# Patient Record
Sex: Male | Born: 1955 | Race: White | Hispanic: No | Marital: Married | Smoking: Former smoker
Health system: Southern US, Community
[De-identification: ages and names within clinical notes are randomized; demographics above are authoritative.]

## PROBLEM LIST (undated history)

## (undated) DIAGNOSIS — B182 Chronic viral hepatitis C: Secondary | ICD-10-CM

## (undated) DIAGNOSIS — K219 Gastro-esophageal reflux disease without esophagitis: Secondary | ICD-10-CM

## (undated) DIAGNOSIS — C801 Malignant (primary) neoplasm, unspecified: Secondary | ICD-10-CM

## (undated) DIAGNOSIS — I10 Essential (primary) hypertension: Secondary | ICD-10-CM

## (undated) HISTORY — DX: Chronic viral hepatitis C: B18.2

## (undated) HISTORY — DX: Essential (primary) hypertension: I10

## (undated) HISTORY — PX: FACIAL RECONSTRUCTION SURGERY: SHX631

---

## 2005-03-17 ENCOUNTER — Emergency Department (HOSPITAL_COMMUNITY): Admission: EM | Admit: 2005-03-17 | Discharge: 2005-03-17 | Payer: Self-pay | Admitting: Emergency Medicine

## 2005-06-06 ENCOUNTER — Emergency Department (HOSPITAL_COMMUNITY): Admission: EM | Admit: 2005-06-06 | Discharge: 2005-06-06 | Payer: Self-pay | Admitting: Emergency Medicine

## 2007-01-25 HISTORY — PX: POLYPECTOMY: SHX149

## 2007-11-14 ENCOUNTER — Emergency Department (HOSPITAL_COMMUNITY): Admission: EM | Admit: 2007-11-14 | Discharge: 2007-11-14 | Payer: Self-pay | Admitting: Emergency Medicine

## 2008-01-09 ENCOUNTER — Ambulatory Visit: Payer: Self-pay | Admitting: Gastroenterology

## 2008-01-16 ENCOUNTER — Ambulatory Visit: Payer: Self-pay | Admitting: Gastroenterology

## 2008-01-23 ENCOUNTER — Encounter: Payer: Self-pay | Admitting: Gastroenterology

## 2009-05-26 ENCOUNTER — Telehealth: Payer: Self-pay | Admitting: Gastroenterology

## 2010-01-24 HISTORY — PX: OTHER SURGICAL HISTORY: SHX169

## 2010-02-23 NOTE — Progress Notes (Signed)
Summary: Schedule Colonoscopy  Phone Note Outgoing Call Call back at [facility] Phone [phone]   Call placed by: Harlow Mares CMA Duncan Dull),  May 26, 2009 10:03 AM Call placed to: Patient Summary of Call: pt lost health insurance and will have some soon she will have her husband call back when they get some again. they are aware he needs a colonoscopy and how important it is. Initial call taken by: Harlow Mares CMA (AAMA),  May 26, 2009 10:05 AM

## 2010-10-25 LAB — POCT CARDIAC MARKERS
CKMB, poc: 4.3
Troponin i, poc: <0.05

## 2010-10-25 LAB — POCT I-STAT, CHEM 8
BUN: 16
Calcium, Ion: 1.23
Glucose, Bld: 111 — ABNORMAL HIGH
HCT: 45
Potassium: 4.5
TCO2: 25

## 2011-07-01 ENCOUNTER — Other Ambulatory Visit: Payer: Self-pay | Admitting: Physician Assistant

## 2011-10-21 ENCOUNTER — Ambulatory Visit: Payer: Self-pay | Admitting: Family Medicine

## 2011-10-21 VITALS — BP 140/90 | HR 99 | Temp 98.7°F | Resp 18 | Ht 71.0 in | Wt 204.0 lb

## 2011-10-21 DIAGNOSIS — I10 Essential (primary) hypertension: Secondary | ICD-10-CM

## 2011-10-21 MED ORDER — LISINOPRIL-HYDROCHLOROTHIAZIDE 20-12.5 MG PO TABS: 1.0000 | ORAL_TABLET | Freq: Every day | ORAL | Status: DC

## 2011-10-21 NOTE — Progress Notes (Signed)
[facility]:  Office Visit  Chief Complaint:  Chief Complaint  Patient presents with  . Medication Refill    been out for 1 month    HPI: Joe Allen is a 56 y.o. male who complains of  HTN-been off meds for several weeks. He has no SEs, He is on lisinopril/HCTZ 20/12.5 mg.  When taking BP meds it is 120s/80s.   Past Medical History  Diagnosis Date  . Hypertension    History reviewed. No pertinent past surgical history. History   Social History  . Marital Status: Married    Spouse Name: N/A    Number of Children: N/A  . Years of Education: N/A   Social History Main Topics  . Smoking status: Never Smoker   . Smokeless tobacco: None  . Alcohol Use: Yes     occass  . Drug Use: No  . Sexually Active: None   Other Topics Concern  . None   Social History Narrative  . None   No family history on file. No Known Allergies Prior to Admission medications   Medication Sig Start Date End Date Taking? Authorizing Provider  lisinopril-hydrochlorothiazide (PRINZIDE,ZESTORETIC) 20-12.5 MG per tablet Take 2 tablets by mouth daily. NEEDS OFFICE VISIT/LABS FOR MORE 07/01/11  Yes Joe Simmonds, PA-C     ROS: The patient denies fevers, chills, night sweats, unintentional weight loss, chest pain, palpitations, wheezing, dyspnea on exertion, nausea, vomiting, abdominal pain, dysuria, hematuria, melena, numbness, weakness, or tingling.   All other systems have been reviewed and were otherwise negative with the exception of those mentioned in the HPI and as above.    PHYSICAL EXAM: Filed Vitals:   10/21/11 1436  BP: 140/90  Pulse:   Temp:   Resp:    Filed Vitals:   10/21/11 1434  Height: 5\' 11"  (1.803 m)  Weight: 204 lb (92.534 kg)   Body mass index is 28.45 kg/(m^2).  General: Alert, no acute distress HEENT:  Normocephalic, atraumatic, oropharynx patent.  Cardiovascular:  Regular rate and rhythm, no rubs murmurs or gallops.  No Carotid bruits,  radial pulse intact. No pedal edema.  Respiratory: Clear to auscultation bilaterally.  No wheezes, rales, or rhonchi.  No cyanosis, no use of accessory musculature GI: No organomegaly, abdomen is soft and non-tender, positive bowel sounds.  No masses. Skin: No rashes. Neurologic: Facial musculature symmetric. Psychiatric: Patient is appropriate throughout our interaction. Lymphatic: No cervical lymphadenopathy Musculoskeletal: Gait intact.   LABS: Results for orders placed during the hospital encounter of 11/14/07  POCT I-STAT, CHEM 8      Component Value Range   Sodium 139     Potassium 4.5     Chloride 107     BUN 16     Creatinine, Ser 0.9     Glucose, Bld 111 (*)    Calcium, Ion 1.23     TCO2 25     Hemoglobin 15.3     HCT 45.0    POCT CARDIAC MARKERS      Component Value Range   Myoglobin, poc 113     CKMB, poc 4.3     Troponin i, poc <0.05    POCT CARDIAC MARKERS      Component Value Range   Myoglobin, poc 84.3     CKMB, poc 3.1     Troponin i, poc <0.05       EKG/XRAY:   Primary read interpreted by Dr. Conley Allen at [facility].   ASSESSMENT/PLAN: Encounter Diagnosis  Name  Primary?  . HTN (hypertension) Yes   Refilled his meds for 3 months, he is getting ready for his daughter's wedding. He promises me that he will return to office to get blood work in next couple of months when he has money. If he doesn't do this then I would change him to another HTN medication that does not require as much bloodwork/follow-up ie Norvasc or metoprolol. Patient knows that if his BP is > 140/90 on meds then needs to come in sooner for recheck to adjust meds.      Joe Capri PHUONG, DO 10/21/2011 2:58 PM

## 2011-10-25 DIAGNOSIS — Z0271 Encounter for disability determination: Secondary | ICD-10-CM

## 2012-02-01 ENCOUNTER — Other Ambulatory Visit: Payer: Self-pay | Admitting: Family Medicine

## 2012-02-02 ENCOUNTER — Ambulatory Visit: Payer: Self-pay | Admitting: Family Medicine

## 2012-02-02 VITALS — BP 135/105 | HR 94 | Temp 98.7°F | Resp 18 | Wt 200.0 lb

## 2012-02-02 DIAGNOSIS — I10 Essential (primary) hypertension: Secondary | ICD-10-CM

## 2012-02-02 LAB — POCT URINALYSIS DIPSTICK
Bilirubin, UA: NEGATIVE
Blood, UA: NEGATIVE
Leukocytes, UA: NEGATIVE
Protein, UA: NEGATIVE
Urobilinogen, UA: 0.2
pH, UA: 6

## 2012-02-02 LAB — COMPREHENSIVE METABOLIC PANEL
Alkaline Phosphatase: 61 U/L (ref 39–117)
Total Bilirubin: 0.8 mg/dL (ref 0.3–1.2)
Total Protein: 8.1 g/dL (ref 6.0–8.3)

## 2012-02-02 MED ORDER — LISINOPRIL-HYDROCHLOROTHIAZIDE 20-12.5 MG PO TABS: 1.0000 | ORAL_TABLET | Freq: Every day | ORAL | Status: DC

## 2012-02-02 NOTE — Assessment & Plan Note (Signed)
Uncontrolled due to non-compliance with medication ;no changes to management made; refill provided; obtain labs.

## 2012-02-02 NOTE — Patient Instructions (Addendum)
1. HTN (hypertension), benign  POCT urinalysis dipstick, Comprehensive metabolic panel  2. HTN (hypertension)  lisinopril-hydrochlorothiazide (PRINZIDE,ZESTORETIC) 20-12.5 MG per tablet     RETURN IN SIX MONTHS FOR BLOOD PRESSURE CHECK UP. ALSO ENCOURAGE SCHEDULING A FULL PHYSICAL EXAM IN UPCOMING FEW MONTHS.

## 2012-02-02 NOTE — Progress Notes (Signed)
[address]   [place], [place]  [address]   [phone]  Subjective:    Patient ID: Joe Allen, male    DOB: Mar 24, 1955, 57 y.o.   MRN: [id number]  HPIThis 57 y.o. male presents for evaluation of HTN.  Due for labs.  Medication running low so only taking every other day; took this morning; last pill.  Taking Lisinopril/HCTZ 20/12.5.  Home readings running 128/70 when compliant with daily medication.  Vegetarian.  No chest pain, palpitations, shortness of breath, leg swelling.  No headaches, dizziness, n/t/w.    Exercises daily; walking one mile every morning; also goes to gym three times per week.   Review of Systems  Constitutional: Negative for chills, diaphoresis and fatigue.  Respiratory: Negative for shortness of breath.   Cardiovascular: Negative for chest pain, palpitations and leg swelling.  Neurological: Negative for dizziness, tremors, syncope, facial asymmetry, speech difficulty, weakness, light-headedness, numbness and headaches.        Past Medical History  Diagnosis Date  . Hypertension     Past Surgical History  Procedure Date  . Colonoscopy 01/24/2010    Prior to Admission medications   Medication Sig Start Date End Date Taking? Authorizing Provider  lisinopril-hydrochlorothiazide (PRINZIDE,ZESTORETIC) 20-12.5 MG per tablet Take 1 tablet by mouth daily. You have 2 refills left. Need to come in for labs before they run out. 10/21/11   Thao P Le, DO    No Known Allergies  History   Social History  . Marital Status: Married    Spouse Name: N/A    Number of Children: N/A  . Years of Education: N/A   Occupational History  . Not on file.   Social History Main Topics  . Smoking status: Former Smoker    Quit date: 02/01/2010  . Smokeless tobacco: Not on file  . Alcohol Use: 1.2 oz/week    2 Cans of beer per week     Comment: occass  . Drug Use: No  . Sexually Active: Yes   Other Topics Concern  . Not on file   Social History Narrative   Marital status: married x 5 years; third marriage   Children: two children; one Museum/gallery conservator; two grandchildren.   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   Drugs: none   Exercise: walking daily; gym three times per week.    Family History  Problem Relation Age of Onset  . Hypertension Father   . Heart disease Father 17    AMI age 82; AMI x 3; stenting.    Objective:   Physical Exam  Nursing note and vitals reviewed. Constitutional: He is oriented to person, place, and time. He appears well-developed and well-nourished. No distress.  Eyes: Conjunctivae normal are normal. Pupils are equal, round, and reactive to light.  Neck: Normal range of motion. Neck supple. No JVD present. No thyromegaly present.  Cardiovascular: Normal rate, regular rhythm and normal heart sounds.  Exam reveals no gallop and no friction rub.   No murmur heard. Pulmonary/Chest: Effort normal and breath sounds normal. He has no wheezes. He has no rales.  Neurological: He is alert and oriented to person, place, and time. No cranial nerve deficit. He exhibits normal muscle tone. Coordination normal.  Skin: He is not diaphoretic.  Psychiatric: He has a normal mood and affect. His behavior is normal.  Assessment & Plan:

## 2012-02-06 ENCOUNTER — Telehealth: Payer: Self-pay

## 2012-02-06 NOTE — Telephone Encounter (Signed)
PT STATES HE WOULD LIKE [place] TO FAX AN OOW NOTE FOR THE 7TH AND 8TH. THE DR ONLY WROTE FOR 1 DAY, BUT HE NEED TO HAVE THOSE DAYS COVERED ALSO PLEASE CALL PT AT [phone] [phone] AND THE FAX IS [phone].

## 2012-02-07 NOTE — Telephone Encounter (Signed)
He was seen for his blood pressure, why does her require time off for this? He was seen on the 9th and given note for the day he was seen, note should not be needed for 2 days before visit. Left message for him to call me back so i can get more information and discuss with him.

## 2012-02-09 ENCOUNTER — Encounter: Payer: Self-pay | Admitting: Family Medicine

## 2012-02-12 NOTE — Telephone Encounter (Signed)
Left another message on Friday/ pt has not returned my calls Amy

## 2012-02-14 NOTE — Progress Notes (Signed)
Lab and appts have been unable to reach this pt, phone out of service. Sent pt letter to schedule f-up with Dr. Katrinka Blazing in 2-4 weeks. Lennon Alstrom

## 2012-08-28 ENCOUNTER — Ambulatory Visit: Payer: Self-pay | Admitting: Emergency Medicine

## 2012-08-28 ENCOUNTER — Other Ambulatory Visit: Payer: Self-pay | Admitting: Geriatric Medicine

## 2012-08-28 VITALS — BP 108/72 | HR 86 | Temp 98.0°F | Resp 16 | Ht 70.0 in | Wt 189.0 lb

## 2012-08-28 DIAGNOSIS — I10 Essential (primary) hypertension: Secondary | ICD-10-CM

## 2012-08-28 MED ORDER — LISINOPRIL 20 MG PO TABS: 20.0000 mg | ORAL_TABLET | Freq: Every day | ORAL | Status: DC

## 2012-08-28 NOTE — Patient Instructions (Signed)

## 2012-08-28 NOTE — Progress Notes (Signed)
[facility] [address] [place] [address] [phone]  Date:  08/28/2012   Name:  Joe Allen   DOB:  [birth year]   MRN:  [id number]  PCP:  No primary provider on file.    Chief Complaint: Medication Refill   History of Present Illness:  Joe Allen is a 57 y.o. very pleasant male patient who presents with the following:  Hypertension.  Under good control.  No adverse effect of medications.  Denies other complaint or health concern today.   Patient Active Problem List   Diagnosis Date Noted  . HTN (hypertension), benign 02/02/2012    Past Medical History  Diagnosis Date  . Hypertension     Past Surgical History  Procedure Laterality Date  . Colonoscopy  01/24/2010    History  Substance Use Topics  . Smoking status: Former Smoker    Quit date: 02/01/2010  . Smokeless tobacco: Not on file  . Alcohol Use: 1.2 oz/week    2 Cans of beer per week     Comment: occass    Family History  Problem Relation Age of Onset  . Hypertension Father   . Heart disease Father 40    AMI age 93; AMI x 3; stenting.    No Known Allergies  Medication list has been reviewed and updated.  Current Outpatient Prescriptions on File Prior to Visit  Medication Sig Dispense Refill  . lisinopril-hydrochlorothiazide (PRINZIDE,ZESTORETIC) 20-12.5 MG per tablet Take 1 tablet by mouth daily.  30 tablet  5   No current facility-administered medications on file prior to visit.    Review of Systems:  As per HPI, otherwise negative.    Physical Examination: Filed Vitals:   08/28/12 1415  BP: 108/72  Pulse: 86  Temp: 98 F (36.7 C)  Resp: 16   Filed Vitals:   08/28/12 1415  Height: 5\' 10"  (1.778 m)  Weight: 189 lb (85.73 kg)   Body mass index is 27.12 kg/(m^2). Ideal Body Weight: Weight in (lb) to have BMI = 25: 173.9  GEN: WDWN, NAD, Non-toxic, A & O x 3 HEENT: Atraumatic, Normocephalic. Neck supple. No masses, No LAD. Ears and Nose: No  external deformity. CV: RRR, No M/G/R. No JVD. No thrill. No extra heart sounds. PULM: CTA B, no wheezes, crackles, rhonchi. No retractions. No resp. distress. No accessory muscle use. ABD: S, NT, ND, +BS. No rebound. No HSM. EXTR: No c/c/e NEURO Normal gait.  PSYCH: Normally interactive. Conversant. Not depressed or anxious appearing.  Calm demeanor.    Assessment and Plan: Hypertension Decrease meds to cut out HCTZ  Signed,  Phillips Odor, MD

## 2013-01-01 ENCOUNTER — Telehealth: Payer: Self-pay

## 2013-01-01 NOTE — Telephone Encounter (Signed)
Patient's wife called wanting an itemized statement from his last visit. If possible please fax it to [phone]; if not they can come pick it up from the billing office.   [phone]

## 2013-01-07 NOTE — Telephone Encounter (Signed)
Faxed with confirmation to [phone]

## 2013-11-15 ENCOUNTER — Other Ambulatory Visit: Payer: Self-pay | Admitting: Emergency Medicine

## 2013-11-29 ENCOUNTER — Ambulatory Visit (INDEPENDENT_AMBULATORY_CARE_PROVIDER_SITE_OTHER): Payer: Self-pay | Admitting: Family Medicine

## 2013-11-29 ENCOUNTER — Encounter: Payer: Self-pay | Admitting: Family Medicine

## 2013-11-29 VITALS — BP 108/70 | HR 77 | Temp 98.0°F | Resp 16 | Ht 70.0 in | Wt 211.8 lb

## 2013-11-29 DIAGNOSIS — M25511 Pain in right shoulder: Secondary | ICD-10-CM

## 2013-11-29 DIAGNOSIS — Z13220 Encounter for screening for lipoid disorders: Secondary | ICD-10-CM

## 2013-11-29 DIAGNOSIS — K7689 Other specified diseases of liver: Secondary | ICD-10-CM

## 2013-11-29 DIAGNOSIS — I10 Essential (primary) hypertension: Secondary | ICD-10-CM

## 2013-11-29 DIAGNOSIS — R945 Abnormal results of liver function studies: Secondary | ICD-10-CM

## 2013-11-29 DIAGNOSIS — N529 Male erectile dysfunction, unspecified: Secondary | ICD-10-CM

## 2013-11-29 MED ORDER — SILDENAFIL CITRATE 100 MG PO TABS: 50.0000 mg | ORAL_TABLET | Freq: Every day | ORAL | Status: DC | PRN

## 2013-11-29 MED ORDER — LISINOPRIL 20 MG PO TABS: 20.0000 mg | ORAL_TABLET | Freq: Every day | ORAL | Status: DC

## 2013-11-29 NOTE — Progress Notes (Signed)
Chief Complaint:  Chief Complaint  Patient presents with  . Medication Refill    LISINOPRIL    HPI: Joe Allen is a 58 y.o. 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 Hypertension    Past Surgical History  Procedure Laterality Date  . Colonoscopy  01/24/2010   History   Social History  . Marital Status: Married    Spouse Name: N/A    Number of Children: N/A  . Years of Education: N/A   Social History Main Topics  . Smoking status: Former Smoker    Quit date: 02/01/2010  . Smokeless tobacco: None  . Alcohol Use: 1.2 oz/week    2 Cans of beer per week     Comment: occass  . Drug Use: No  . Sexual Activity: Yes   Other Topics Concern  . None   Social History Narrative   Marital status: married x 5 years; third marriage      Children: two children; one Psychiatrist; two grandchildren.      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      Drugs: none      Exercise: walking daily; gym three times per week.   Family History  Problem Relation Age of Onset  . Hypertension Father   . Heart disease Father 51    AMI age 18; AMI x 3; stenting.   Allergies  Allergen Reactions  . Nsaids Other (See Comments)  . Codeine Other (See Comments)     PER  PATIENT GIVES UPSET STOMACH   Prior to Admission medications   Medication Sig Start Date End Date Taking? Authorizing Provider  lisinopril (PRINIVIL,ZESTRIL) 20 MG tablet Take 1 tablet (20 mg total) by mouth daily. 11/18/13  Yes Mancel Bale, PA-C  lisinopril-hydrochlorothiazide (PRINZIDE,ZESTORETIC) 20-12.5 MG per tablet Take 1 tablet by mouth daily. 02/02/12   Wardell Honour, MD     ROS: The patient denies fevers, chills, night sweats, unintentional weight loss, chest pain, palpitations, wheezing, dyspnea on exertion, nausea, vomiting, abdominal pain, dysuria, hematuria, melena, numbness, weakness, or tingling.   All other systems have been reviewed and were otherwise negative with the exception of those mentioned in the HPI and as above.    PHYSICAL EXAM: Filed Vitals:   11/29/13 1435  BP: 108/70  Pulse: 77  Temp: 98 F (36.7 C)  Resp: 16   Filed Vitals:   11/29/13 1435  Height: 5\' 10"  (1.778 m)  Weight: 211 lb 12.8 oz (96.072 kg)   Body mass index is 30.39 kg/(m^2).  General: Alert, no acute distress  HEENT:  Normocephalic, atraumatic, oropharynx patent. EOMI, PERRLA Cardiovascular:  Regular rate and rhythm, no rubs murmurs or gallops.  No Carotid bruits, radial pulse intact. No pedal edema.  Respiratory: Clear to auscultation bilaterally.  No wheezes, rales, or rhonchi.  No cyanosis, no use of accessory musculature GI: No organomegaly, abdomen is soft and non-tender, positive bowel sounds.  No masses. Skin: No rashes. Neurologic: Facial musculature symmetric. Psychiatric: Patient is appropriate throughout our interaction. Lymphatic: No cervical lymphadenopathy Musculoskeletal: Gait intact. Neck-nl, neg spurling + tenderness right lateral deltoid Full ROM but has + Hawkins/Neers, neg empty can  5/5 strength, 2/2 DTRs sesnation intact    LABS: Results for orders placed or performed in visit on 11/29/13  COMPLETE METABOLIC PANEL WITH GFR  Result Value Ref Range    Sodium 134 (L) 135 - 145 mEq/L   Potassium 4.2 3.5 - 5.3 mEq/L   Chloride 102 96 - 112 mEq/L   CO2 20 19 - 32 mEq/L   Glucose, Bld 96 70 - 99 mg/dL   BUN 13 6 - 23 mg/dL   Creat 1.00 0.50 - 1.35 mg/dL   Total Bilirubin 0.6 0.2 - 1.2 mg/dL   Alkaline Phosphatase 50 39 - 117 U/L   AST 56 (H) 0 - 37 U/L   ALT 93 (H) 0 - 53 U/L   Total Protein 7.6 6.0 - 8.3 g/dL   Albumin 4.1 3.5 - 5.2 g/dL   Calcium 9.3 8.4 - 10.5 mg/dL   GFR, Est African American >89 mL/min   GFR, Est Non African American 83 mL/min     EKG/XRAY:   Primary read interpreted by Dr. Marin Comment at [facility].   ASSESSMENT/PLAN: Encounter Diagnoses  Name Primary?  . Essential hypertension Yes  . Abnormal liver function   . Screening for hyperlipidemia   . Erectile dysfunction, unspecified erectile dysfunction type   . Right shoulder pain    Refill meds CMP pending Ibuprofen prn pain, he will return for xray and steroid injections if no improvement  Gross sideeffects, risk and benefits, and alternatives of medications d/w patient. Patient is aware that all medications have potential sideeffects and we are unable to predict every sideeffect or drug-drug interaction that may occur.  LE, Annabella, DO 11/30/2013 12:44 PM

## 2013-11-29 NOTE — Patient Instructions (Signed)
DASH Eating Plan °DASH stands for "Dietary Approaches to Stop Hypertension." The DASH eating plan is a healthy eating plan that has been shown to reduce high blood pressure (hypertension). Additional health benefits may include reducing the risk of type 2 diabetes mellitus, heart disease, and stroke. The DASH eating plan may also help with weight loss. °WHAT DO I NEED TO KNOW ABOUT THE DASH EATING PLAN? °For the DASH eating plan, you will follow these general guidelines: °· Choose foods with a percent daily value for sodium of less than 5% (as listed on the food label). °· Use salt-free seasonings or herbs instead of table salt or sea salt. °· Check with your health care provider or pharmacist before using salt substitutes. °· Eat lower-sodium products, often labeled as "lower sodium" or "no salt added." °· Eat fresh foods. °· Eat more vegetables, fruits, and low-fat dairy products. °· Choose whole grains. Look for the word "whole" as the first word in the ingredient list. °· Choose fish and skinless chicken or turkey more often than red meat. Limit fish, poultry, and meat to 6 oz (170 g) each day. °· Limit sweets, desserts, sugars, and sugary drinks. °· Choose heart-healthy fats. °· Limit cheese to 1 oz (28 g) per day. °· Eat more home-cooked food and less restaurant, buffet, and fast food. °· Limit fried foods. °· Cook foods using methods other than frying. °· Limit canned vegetables. If you do use them, rinse them well to decrease the sodium. °· When eating at a restaurant, ask that your food be prepared with less salt, or no salt if possible. °WHAT FOODS CAN I EAT? °Seek help from a dietitian for individual calorie needs. °Grains °Whole grain or whole wheat bread. Brown rice. Whole grain or whole wheat pasta. Quinoa, bulgur, and whole grain cereals. Low-sodium cereals. Corn or whole wheat flour tortillas. Whole grain cornbread. Whole grain crackers. Low-sodium crackers. °Vegetables °Fresh or frozen vegetables  (raw, steamed, roasted, or grilled). Low-sodium or reduced-sodium tomato and vegetable juices. Low-sodium or reduced-sodium tomato sauce and paste. Low-sodium or reduced-sodium canned vegetables.  °Fruits °All fresh, canned (in natural juice), or frozen fruits. °Meat and Other Protein Products °Ground beef (85% or leaner), grass-fed beef, or beef trimmed of fat. Skinless chicken or turkey. Ground chicken or turkey. Pork trimmed of fat. All fish and seafood. Eggs. Dried beans, peas, or lentils. Unsalted nuts and seeds. Unsalted canned beans. °Dairy °Low-fat dairy products, such as skim or 1% milk, 2% or reduced-fat cheeses, low-fat ricotta or cottage cheese, or plain low-fat yogurt. Low-sodium or reduced-sodium cheeses. °Fats and Oils °Tub margarines without trans fats. Light or reduced-fat mayonnaise and salad dressings (reduced sodium). Avocado. Safflower, olive, or canola oils. Natural peanut or almond butter. °Other °Unsalted popcorn and pretzels. °The items listed above may not be a complete list of recommended foods or beverages. Contact your dietitian for more options. °WHAT FOODS ARE NOT RECOMMENDED? °Grains °White bread. White pasta. White rice. Refined cornbread. Bagels and croissants. Crackers that contain trans fat. °Vegetables °Creamed or fried vegetables. Vegetables in a cheese sauce. Regular canned vegetables. Regular canned tomato sauce and paste. Regular tomato and vegetable juices. °Fruits °Dried fruits. Canned fruit in light or heavy syrup. Fruit juice. °Meat and Other Protein Products °Fatty cuts of meat. Ribs, chicken wings, bacon, sausage, bologna, salami, chitterlings, fatback, hot dogs, bratwurst, and packaged luncheon meats. Salted nuts and seeds. Canned beans with salt. °Dairy °Whole or 2% milk, cream, half-and-half, and cream cheese. Whole-fat or sweetened yogurt. Full-fat   cheeses or blue cheese. Nondairy creamers and whipped toppings. Processed cheese, cheese spreads, or cheese  curds. °Condiments °Onion and garlic salt, seasoned salt, table salt, and sea salt. Canned and packaged gravies. Worcestershire sauce. Tartar sauce. Barbecue sauce. Teriyaki sauce. Soy sauce, including reduced sodium. Steak sauce. Fish sauce. Oyster sauce. Cocktail sauce. Horseradish. Ketchup and mustard. Meat flavorings and tenderizers. Bouillon cubes. Hot sauce. Tabasco sauce. Marinades. Taco seasonings. Relishes. °Fats and Oils °Butter, stick margarine, lard, shortening, ghee, and bacon fat. Coconut, palm kernel, or palm oils. Regular salad dressings. °Other °Pickles and olives. Salted popcorn and pretzels. °The items listed above may not be a complete list of foods and beverages to avoid. Contact your dietitian for more information. °WHERE CAN I FIND MORE INFORMATION? °National Heart, Lung, and Blood Institute: www.nhlbi.nih.gov/health/health-topics/topics/dash/ °Document Released: 12/30/2010 Document Revised: 05/27/2013 Document Reviewed: 11/14/2012 °ExitCare® Patient Information ©2015 [employer]. This information is not intended to replace advice given to you by your health care provider. Make sure you discuss any questions you have with your health care provider. ° °

## 2013-11-30 LAB — COMPLETE METABOLIC PANEL WITHOUT GFR
ALT: 93 U/L — ABNORMAL HIGH (ref 0–53)
AST: 56 U/L — ABNORMAL HIGH (ref 0–37)
Alkaline Phosphatase: 50 U/L (ref 39–117)
Glucose, Bld: 96 mg/dL (ref 70–99)
Sodium: 134 meq/L — ABNORMAL LOW (ref 135–145)
Total Bilirubin: 0.6 mg/dL (ref 0.2–1.2)
Total Protein: 7.6 g/dL (ref 6.0–8.3)

## 2013-11-30 LAB — COMPLETE METABOLIC PANEL WITH GFR
Albumin: 4.1 g/dL (ref 3.5–5.2)
BUN: 13 mg/dL (ref 6–23)
CO2: 20 mEq/L (ref 19–32)
Calcium: 9.3 mg/dL (ref 8.4–10.5)
Chloride: 102 mEq/L (ref 96–112)
Creat: 1 mg/dL (ref 0.50–1.35)
GFR, Est African American: >89 mL/min
GFR, Est Non African American: 83 mL/min
Potassium: 4.2 mEq/L (ref 3.5–5.3)

## 2013-12-02 ENCOUNTER — Telehealth: Payer: Self-pay

## 2013-12-02 NOTE — Telephone Encounter (Signed)
Letter sent and faxed as pt instructed. Mailed original to home address. Pt advised.

## 2013-12-02 NOTE — Telephone Encounter (Signed)
Pt states he needs out of work note for Friday nov 6 for his appt here with dr Einar Crow fax to [phone]    Best phone [phone]

## 2013-12-31 ENCOUNTER — Encounter: Payer: Self-pay | Admitting: Family Medicine

## 2013-12-31 ENCOUNTER — Other Ambulatory Visit: Payer: Self-pay | Admitting: Family Medicine

## 2013-12-31 ENCOUNTER — Telehealth: Payer: Self-pay | Admitting: Family Medicine

## 2013-12-31 DIAGNOSIS — R748 Abnormal levels of other serum enzymes: Secondary | ICD-10-CM

## 2013-12-31 DIAGNOSIS — Z13220 Encounter for screening for lipoid disorders: Secondary | ICD-10-CM

## 2013-12-31 NOTE — Telephone Encounter (Signed)
LM that liver enzymes still high, I think we should investigate a little more with hepatitis panel , lipid panel and [place] of liver.

## 2014-04-28 ENCOUNTER — Ambulatory Visit
Admission: RE | Admit: 2014-04-28 | Discharge: 2014-04-28 | Disposition: A | Discharge status: Home / Self Care | Payer: Self-pay | Source: Ambulatory Visit | Attending: Family Medicine | Admitting: Family Medicine

## 2014-04-28 DIAGNOSIS — R748 Abnormal levels of other serum enzymes: Secondary | ICD-10-CM

## 2014-09-02 ENCOUNTER — Ambulatory Visit (INDEPENDENT_AMBULATORY_CARE_PROVIDER_SITE_OTHER): Payer: Commercial Managed Care - PPO

## 2014-09-02 ENCOUNTER — Ambulatory Visit (INDEPENDENT_AMBULATORY_CARE_PROVIDER_SITE_OTHER): Payer: Commercial Managed Care - PPO | Admitting: Family Medicine

## 2014-09-02 VITALS — BP 118/82 | HR 72 | Temp 98.2°F | Resp 14 | Ht 70.0 in | Wt 213.0 lb

## 2014-09-02 DIAGNOSIS — Z113 Encounter for screening for infections with a predominantly sexual mode of transmission: Secondary | ICD-10-CM | POA: Diagnosis not present

## 2014-09-02 DIAGNOSIS — R945 Abnormal results of liver function studies: Secondary | ICD-10-CM

## 2014-09-02 DIAGNOSIS — Z13220 Encounter for screening for lipoid disorders: Secondary | ICD-10-CM | POA: Diagnosis not present

## 2014-09-02 DIAGNOSIS — N529 Male erectile dysfunction, unspecified: Secondary | ICD-10-CM

## 2014-09-02 DIAGNOSIS — K7689 Other specified diseases of liver: Secondary | ICD-10-CM

## 2014-09-02 DIAGNOSIS — M25511 Pain in right shoulder: Secondary | ICD-10-CM | POA: Diagnosis not present

## 2014-09-02 DIAGNOSIS — Z860100 Personal history of colon polyps, unspecified: Secondary | ICD-10-CM

## 2014-09-02 DIAGNOSIS — I10 Essential (primary) hypertension: Secondary | ICD-10-CM | POA: Diagnosis not present

## 2014-09-02 LAB — POCT CBC
Granulocyte percent: 67.9 % (ref 37–80)
HCT, POC: 47.3 % (ref 43.5–53.7)
Hemoglobin: 15.1 g/dL (ref 14.1–18.1)
Lymph, poc: 1.1 (ref 0.6–3.4)
MCH, POC: 28.6 pg (ref 27–31.2)
MCHC: 31.9 g/dL (ref 31.8–35.4)
MCV: 89.8 fL (ref 80–97)
MID (cbc): 0.5 (ref 0–0.9)
MPV: 7.8 fL (ref 0–99.8)
POC Granulocyte: 3.4 (ref 2–6.9)
POC LYMPH PERCENT: 22.6 %L (ref 10–50)
POC MID %: 9.5 % (ref 0–12)
Platelet Count, POC: 170 10*3/uL (ref 142–424)
RBC: 5.27 M/uL (ref 4.69–6.13)
RDW, POC: 14.3 %
WBC: 5 10*3/uL (ref 4.6–10.2)

## 2014-09-02 LAB — COMPLETE METABOLIC PANEL WITH GFR
Alkaline Phosphatase: 69 U/L (ref 40–115)
GFR, Est African American: >89 mL/min (ref >=60)
GFR, Est Non African American: >89 mL/min (ref >=60)
Glucose, Bld: 103 mg/dL — ABNORMAL HIGH (ref 65–99)
Total Protein: 8.3 g/dL — ABNORMAL HIGH (ref 6.1–8.1)

## 2014-09-02 LAB — LIPID PANEL
Cholesterol: 200 mg/dL (ref 125–200)
HDL: 65 mg/dL (ref >=40)
LDL Cholesterol: 122 mg/dL (ref <130)
Total CHOL/HDL Ratio: 3.1 Ratio (ref <=5.0)
Triglycerides: 67 mg/dL (ref <150)
VLDL: 13 mg/dL (ref <30)

## 2014-09-02 LAB — HEPATITIS B SURFACE ANTIGEN: Hepatitis B Surface Ag: NEGATIVE

## 2014-09-02 LAB — COMPLETE METABOLIC PANEL WITHOUT GFR
ALT: 171 U/L — ABNORMAL HIGH (ref 9–46)
AST: 101 U/L — ABNORMAL HIGH (ref 10–35)
Albumin: 4.4 g/dL (ref 3.6–5.1)
BUN: 8 mg/dL (ref 7–25)
CO2: 28 mmol/L (ref 20–31)
Calcium: 9.8 mg/dL (ref 8.6–10.3)
Chloride: 103 mmol/L (ref 98–110)
Creat: 0.74 mg/dL (ref 0.70–1.33)
Potassium: 4.7 mmol/L (ref 3.5–5.3)
Sodium: 139 mmol/L (ref 135–146)
Total Bilirubin: 0.7 mg/dL (ref 0.2–1.2)

## 2014-09-02 LAB — HIV ANTIBODY (ROUTINE TESTING W REFLEX): HIV 1&2 Ab, 4th Generation: NONREACTIVE

## 2014-09-02 LAB — HEPATITIS B SURFACE ANTIBODY, QUANTITATIVE: Hep B S AB Quant (Post): 0 m[IU]/mL

## 2014-09-02 LAB — HEPATITIS C ANTIBODY: HCV Ab: REACTIVE — AB

## 2014-09-02 MED ORDER — SILDENAFIL CITRATE 100 MG PO TABS
50.0000 mg | ORAL_TABLET | Freq: Every day | ORAL | Status: AC | PRN
Start: 2014-09-02 — End: ?

## 2014-09-02 MED ORDER — TIZANIDINE HCL 4 MG PO CAPS: 4.0000 mg | ORAL_CAPSULE | Freq: Every evening | ORAL | Status: DC | PRN

## 2014-09-02 MED ORDER — LISINOPRIL 20 MG PO TABS: 20.0000 mg | ORAL_TABLET | Freq: Every day | ORAL | Status: DC

## 2014-09-02 NOTE — Progress Notes (Signed)
Chief Complaint:  Chief Complaint  Patient presents with  . Follow-up    Check up - bloodwork, need referral for colonoscopy  . Shoulder Pain    Wants to discuss rotator cuff     HPI: Joe Allen is a 59 y.o. male who reports to [facility] today complaining of : 1. recheck of his abnormal liver enzymes. We had discussed this a while back. Liver enzymes were elvated at the time. But he did not have insurance so only an [place] was done and he never returned for recheck of liver enzymes and hepatitis panel. Denies high cholesterol, denies excessive alcohol use.No IVDAs.  2.  He needs a referral for colonoscopy. NO GI sxs, no family hx of colon cancer 3. He also is here for recheck of his right rotator cuff pain. It's improved but not completely resolved. 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 He declines to get a steroid injection, he cannot take NSAIDs due to GIB 3. He has high blood pressure is doing well, has had it for the last 3 or 4 years. Does not check his blood pressure. No side effects from the medication.  Past Medical History  Diagnosis Date  . Hypertension    Past Surgical History  Procedure Laterality Date  . Colonoscopy  01/24/2010   History   Social History  . Marital Status: Married    Spouse Name: N/A  . Number of Children: N/A  . Years of Education: N/A   Social History Main Topics  . Smoking status: Former Smoker    Quit date: 02/01/2010  . Smokeless tobacco: Not on file  . Alcohol Use: 1.2 oz/week    2 Cans of beer per week     Comment: occass  . Drug Use: No  . Sexual Activity: Yes   Other Topics Concern  . None   Social History Narrative   Marital status: married x 5 years; third marriage      Children: two children; one Psychiatrist; two grandchildren.      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      Drugs: none      Exercise: walking daily; gym three times per week.   Family History  Problem Relation Age of Onset  . Hypertension Father   . Heart disease Father 48    AMI age 35; AMI x 3; stenting.   Allergies  Allergen Reactions  . Nsaids Other (See Comments)  . Codeine Other (See Comments)     PER PATIENT GIVES UPSET STOMACH   Prior to Admission medications   Medication Sig Start Date End Date Taking? Authorizing Provider  lisinopril (PRINIVIL,ZESTRIL) 20 MG tablet Take 1 tablet (20 mg total) by mouth daily. 09/02/14  Yes Semaje Kinker P Silver Parkey, DO  sildenafil (VIAGRA) 100 MG tablet Take 0.5-1 tablets (50-100 mg total) by mouth daily as needed for erectile dysfunction. 09/02/14  Yes Beverley Sherrard P Rhyder Bratz, DO  tiZANidine (ZANAFLEX) 4 MG capsule Take 1 capsule (4 mg total) by mouth at bedtime as needed for muscle spasms. 09/02/14   Merikay Lesniewski P Messiyah Waterson, DO     ROS: The patient denies fevers, chills, night  sweats, unintentional weight loss, chest pain, palpitations, wheezing, dyspnea on exertion, nausea, vomiting, abdominal pain, dysuria, hematuria, melena, numbness, weakness, or tingling.   All other systems have been reviewed and were otherwise negative with the exception of those mentioned in the HPI and as above.    PHYSICAL EXAM: Filed Vitals:   09/02/14 0926  BP: 118/82  Pulse: 72  Temp: 98.2 F (36.8 C)  Resp: 14   Body mass index is 30.56 kg/(m^2).   General: Alert, no acute distress HEENT:  Normocephalic, atraumatic, oropharynx patent. EOMI, PERRLA, no jaundice Cardiovascular:  Regular rate and rhythm, no rubs murmurs or gallops.  No Carotid bruits, radial pulse intact. No pedal edema.  Respiratory: Clear to auscultation bilaterally.  No wheezes, rales, or rhonchi.  No cyanosis, no use of accessory musculature Abdominal: No organomegaly, abdomen is soft and non-tender, positive bowel  sounds. No masses. Skin: No rashes. Neurologic: Facial musculature symmetric. Psychiatric: Patient acts appropriately throughout our interaction. Lymphatic: No cervical or submandibular lymphadenopathy Musculoskeletal: Gait intact. No edema, tenderness Neck exam normal-neg spurling Shoulder No deformity, no hypertrophy/atrophy, no erythema, no fluid, no wounds Decrease ROM extension Nontender at [facility] jt Neg  Empty Can test, + Lift off test, neg Speeds, + Hawkins/Neers 5/5 strength, 2/2 triceps and biceps DTRs       LABS: Results for orders placed or performed in visit on 09/02/14  POCT CBC  Result Value Ref Range   WBC 5.0 4.6 - 10.2 K/uL   Lymph, poc 1.1 0.6 - 3.4   POC LYMPH PERCENT 22.6 10 - 50 %L   MID (cbc) 0.5 0 - 0.9   POC MID % 9.5 0 - 12 %M   POC Granulocyte 3.4 2 - 6.9   Granulocyte percent 67.9 37 - 80 %G   RBC 5.27 4.69 - 6.13 M/uL   Hemoglobin 15.1 14.1 - 18.1 g/dL   HCT, POC 47.3 43.5 - 53.7 %   MCV 89.8 80 - 97 fL   MCH, POC 28.6 27 - 31.2 pg   MCHC 31.9 31.8 - 35.4 g/dL   RDW, POC 14.3 %   Platelet Count, POC 170.0 142 - 424 K/uL   MPV 7.8 0 - 99.8 fL     EKG/XRAY:   Primary read interpreted by Dr. Marin Comment at [facility]. Negative for any fracture or dislocation  ASSESSMENT/PLAN: Encounter Diagnoses  Name Primary?  . Essential hypertension Yes  . Abnormal liver function   . Right shoulder pain   . History of colonic polyps   . Screening for STD (sexually transmitted disease)   . Screening for hyperlipidemia   . Erectile dysfunction, unspecified erectile dysfunction type    Refilled meds Labs pending Refer to GI for colonscopy , advise no tylenol due to elevated liver enzymes Declines injection, will do home exercises, if no improvement then will send to PT Fu prn   Gross sideeffects, risk and benefits, and alternatives of medications d/w patient. Patient is aware that all medications have potential sideeffects and we are unable to predict every  sideeffect or drug-drug interaction that may occur.  Takeyah Wieman DO  09/02/2014 11:05 AM

## 2014-09-02 NOTE — Patient Instructions (Signed)

## 2014-09-05 ENCOUNTER — Telehealth: Payer: Self-pay | Admitting: Family Medicine

## 2014-09-05 DIAGNOSIS — R768 Other specified abnormal immunological findings in serum: Secondary | ICD-10-CM

## 2014-09-05 LAB — HEPATITIS C RNA QUANTITATIVE
HCV Quantitative Log: 5.83 {Log} — ABNORMAL HIGH (ref <1.18)
HCV Quantitative: 677311 [IU]/mL — ABNORMAL HIGH (ref <15)

## 2014-09-05 NOTE — Telephone Encounter (Signed)
LM to call me about labs, referred to Hep C clinic

## 2014-09-25 ENCOUNTER — Encounter: Payer: Self-pay | Admitting: Family Medicine

## 2014-10-02 ENCOUNTER — Other Ambulatory Visit: Payer: Commercial Managed Care - PPO

## 2014-10-02 DIAGNOSIS — B182 Chronic viral hepatitis C: Secondary | ICD-10-CM

## 2014-10-02 LAB — CBC WITH DIFFERENTIAL/PLATELET
BASOS PCT: 1 % (ref 0–1)
Basophils Absolute: 0 10*3/uL (ref 0.0–0.1)
EOS PCT: 5 % (ref 0–5)
Eosinophils Absolute: 0.2 10*3/uL (ref 0.0–0.7)
HEMATOCRIT: 42.5 % (ref 39.0–52.0)
Hemoglobin: 15 g/dL (ref 13.0–17.0)
Lymphocytes Relative: 29 % (ref 12–46)
Lymphs Abs: 1.4 10*3/uL (ref 0.7–4.0)
MCH: 30.1 pg (ref 26.0–34.0)
MCHC: 35.3 g/dL (ref 30.0–36.0)
MCV: 85.2 fL (ref 78.0–100.0)
MONO ABS: 0.5 10*3/uL (ref 0.1–1.0)
MPV: 10 fL (ref 8.6–12.4)
Monocytes Relative: 11 % (ref 3–12)
Neutro Abs: 2.6 10*3/uL (ref 1.7–7.7)
Neutrophils Relative %: 54 % (ref 43–77)
Platelets: 146 10*3/uL — ABNORMAL LOW (ref 150–400)
RBC: 4.99 MIL/uL (ref 4.22–5.81)
RDW: 13.8 % (ref 11.5–15.5)
WBC: 4.8 10*3/uL (ref 4.0–10.5)

## 2014-10-02 LAB — COMPREHENSIVE METABOLIC PANEL
ALBUMIN: 4.1 g/dL (ref 3.6–5.1)
ALK PHOS: 68 U/L (ref 40–115)
ALT: 135 U/L — AB (ref 9–46)
AST: 87 U/L — ABNORMAL HIGH (ref 10–35)
BUN: 13 mg/dL (ref 7–25)
CALCIUM: 9.4 mg/dL (ref 8.6–10.3)
CO2: 26 mmol/L (ref 20–31)
Chloride: 103 mmol/L (ref 98–110)
Creat: 0.84 mg/dL (ref 0.70–1.33)
Glucose, Bld: 103 mg/dL — ABNORMAL HIGH (ref 65–99)
POTASSIUM: 4.9 mmol/L (ref 3.5–5.3)
Sodium: 136 mmol/L (ref 135–146)
TOTAL PROTEIN: 8 g/dL (ref 6.1–8.1)
Total Bilirubin: 0.8 mg/dL (ref 0.2–1.2)

## 2014-10-02 LAB — HEPATITIS A ANTIBODY, TOTAL: HEP A TOTAL AB: NONREACTIVE

## 2014-10-02 LAB — IRON: Iron: 121 ug/dL (ref 50–180)

## 2014-10-02 LAB — PROTIME-INR
INR: 1.04 (ref <1.50)
PROTHROMBIN TIME: 13.7 s (ref 11.6–15.2)

## 2014-10-02 LAB — HEPATITIS B CORE ANTIBODY, TOTAL: HEP B C TOTAL AB: NONREACTIVE

## 2014-10-03 LAB — ANA: ANA: NEGATIVE

## 2014-10-03 LAB — HEPATITIS C GENOTYPE: HCV Genotype: 3

## 2014-10-30 ENCOUNTER — Ambulatory Visit (AMBULATORY_SURGERY_CENTER): Payer: Self-pay

## 2014-10-30 VITALS — Ht 70.0 in | Wt 221.0 lb

## 2014-10-30 DIAGNOSIS — Z86010 Personal history of colonic polyps: Secondary | ICD-10-CM

## 2014-10-30 MED ORDER — NA SULFATE-K SULFATE-MG SULF 17.5-3.13-1.6 GM/177ML PO SOLN: 1.0000 | Freq: Once | ORAL | Status: DC

## 2014-10-30 NOTE — Progress Notes (Signed)
No egg or soy allergies Not on home 02 No previous anesthesia complications No diet or weight loss meds Emmi video sent to [email or web address]

## 2014-11-04 ENCOUNTER — Encounter: Payer: Self-pay | Admitting: Internal Medicine

## 2014-11-04 ENCOUNTER — Ambulatory Visit (INDEPENDENT_AMBULATORY_CARE_PROVIDER_SITE_OTHER): Payer: Commercial Managed Care - PPO | Admitting: Internal Medicine

## 2014-11-04 VITALS — BP 153/94 | HR 67 | Temp 97.7°F | Ht 70.0 in | Wt 222.0 lb

## 2014-11-04 DIAGNOSIS — Z23 Encounter for immunization: Secondary | ICD-10-CM | POA: Diagnosis not present

## 2014-11-04 DIAGNOSIS — B182 Chronic viral hepatitis C: Secondary | ICD-10-CM | POA: Diagnosis not present

## 2014-11-04 MED ORDER — SOFOSBUVIR-VELPATASVIR 400-100 MG PO TABS: 1.0000 | ORAL_TABLET | Freq: Every day | ORAL | Status: DC

## 2014-11-04 NOTE — Patient Instructions (Signed)
Date 11/04/2014  Dear Joe Allen, As discussed in the [facility], your hepatitis C therapy will include the following medications:    sofosbuvir 400 mg/velpatasvir 100 mg (Epclusa) oral daily                                         OR sofosbuvir 400 mg oral daily + daclatasvir 60 mg oral daily  Please note that ALL MEDICATIONS WILL START ON THE SAME DATE for a total of 12 weeks. ---------------------------------------------------------------- Your HCV Treatment Start Date: TBA   Your HCV genotype: 3    Liver Fibrosis: TBD    ---------------------------------------------------------------- YOUR PHARMACY CONTACT:   [facility] [place] Phone: [phone] Hours: Monday to Friday 7:30 am to 6:00 pm   Please always contact your pharmacy at least 3-4 business days before you run out of medications to ensure your next month's medication is ready or 1 week prior to running out if you receive it by mail.  Remember, each prescription is for 28 days. ---------------------------------------------------------------- GENERAL NOTES REGARDING YOUR HEPATITIS C MEDICATION:  SOFOSBUVIR ([name] or EPCLUSA): - Sofosbuvir 400 mg tablet is taken daily with OR without food. - The sofosbuvir tablets are yellow. - The Epclusa tablets are pink, diamond-shaped - The tablets should be stored at room temperature. - The most common side effects with sofosbuvir or Epclusa include:      1. Fatigue      2. Headache      3. Nausea      4. Diarrhea      5. Insomnia  - Acid reducing agents such as H2 blockers (ie. Pepcid (famotidine), Zantac (ranitidine), Tagamet (cimetidine), Axid (nizatidine) and proton pump inhibitors (ie. Prilosec (omeprazole), Protonix (pantoprazole), Nexium (esomeprazole), or Aciphex (rabeprazole)) can decrease effectiveness of Epclusa. Do not take until you have discussed with a health care provider.    -Antacids  that contain magnesium and/or aluminum hydroxide (ie. Milk of Magensia, Rolaids, Gaviscon, Maalox, Mylanta, an dArthritis Pain Formula)can reduce absorption of sofosbuvir, so take them at least 4 hours before or after Harvoni.  -Calcium carbonate (calcium supplements or antacids such as Tums, Caltrate, Os-Cal)needs to be taken at least 4 hours hours before or after sofosbuvir.  -St. John's wort or any products that contain St. John's wort like some herbal supplements  Please inform the office prior to starting any of these medications.  DACLATASVIR Joe Allen) - The common side effects with daclatasvir:      1. Fatigue      2. Headache   Support Path is a suite of resources designed to help patients start with Joe Allen and [name] move toward treatment completion Central helps patients access therapy and get off to an efficient start  Benefits investigation and prior authorization support Co-pay and other financial assistance A specialty pharmacy finder CO-PAY COUPON The sofosbuvir co-pay coupon may help eligible patients lower their out-of-pocket costs. With a co-pay coupon, most eligible patients may pay no more than $5 per co-pay (restrictions apply) www.harvoni.com call 941-524-5559 Not valid for patients enrolled in government healthcare prescription drug programs, such as Medicare Part D and Medicaid. Patients in the coverage gap known as the "donut hole" also are not eligible   Please note that this only lists the most common side effects and is NOT a comprehensive list of the  potential side effects of these medications. For more information, please review the drug information sheets that come with your medication package from the pharmacy.  ---------------------------------------------------------------- GENERAL HELPFUL HINTS ON HCV THERAPY: 1. No alcohol. 2. Protect against sun-sensitivity/sunburns (wear sunglasses, hat, long sleeves, pants and  sunscreen). 3. Stay well-hydrated/well-moisturized. 4. Notify the ID Clinic of any changes in your other over-the-counter/herbal or prescription medications. 5. If you miss a dose of your medication, take the missed dose as soon as you remember. Return to your regular time/dose schedule the next day.  6.  Do not stop taking your medications without first talking with your healthcare provider. 7.  You may take Tylenol (acetaminophen), as long as the dose is less than 2000 mg (OR no more than 4 tablets of the Tylenol Extra Strengths 500mg  tablet) in 24 hours. 8.  You will need to obtain routine labs and/or office visits at [facility] at weeks 2, 4, 8,  and 12 as well as 12 and 24 weeks after completion of treatment.   Joe Allen, Joe Allen for Joe Allen Joe Allen Joe Allen, Joe Allen  [phone]

## 2014-11-04 NOTE — Progress Notes (Signed)
[facility]   HPI:  +Joe Allen is a 59 y.o. male who presents for initial evaluation and management of chronic hepatitis C.  Patient tested positive earlier this year. 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 Patient denies acupuncture, history of blood transfusion, intranasal drug use, multiple sexual partners, renal dialysis, sexual contact with person with liver disease, tattoos. Patient has had other studies performed. Results: hepatitis C RNA by PCR, result: positive. Patient has not had prior treatment for Hepatitis C. Patient does not have a past history of liver disease. Patient does not have a family history of liver disease.  He was tested due to transaminitis and found hepatitis C.  Remote IV drug use and was in the [employer] in [place] era.    Patient does not have documented immunity to Hepatitis A. Patient does not have documented immunity to Hepatitis B.     Review of Systems A comprehensive review of systems was negative except for: Constitutional: positive for occasional fatigue   Past Medical History  Diagnosis Date  . Hypertension     Prior to Admission medications   Medication Sig Start Date End Date Taking? Authorizing Provider  lisinopril (PRINIVIL,ZESTRIL) 20 MG tablet Take 1 tablet (20 mg total) by mouth daily. 09/02/14   Thao P Le, DO  Na Sulfate-K Sulfate-Mg Sulf (SUPREP BOWEL PREP) SOLN Take 1 kit by mouth once. Name brand only, no substitutions, take as directed 10/30/14   Manus Gunning, MD  sildenafil (VIAGRA) 100 MG tablet Take 0.5-1 tablets (50-100 mg total) by mouth daily as needed for erectile dysfunction. 09/02/14   Thao P Le, DO  tiZANidine (ZANAFLEX) 4 MG capsule Take 1 capsule (4 mg total) by mouth at bedtime as needed for muscle spasms. 09/02/14   Thao P Le, DO    Allergies  Allergen Reactions  . Nsaids Other (See Comments)  . Codeine Other (See Comments)     PER PATIENT GIVES  UPSET STOMACH    Social History  Substance Use Topics  . Smoking status: Former Smoker    Quit date: 02/01/2010  . Smokeless tobacco: Never Used  . Alcohol Use: No     Comment: occass    Family History  Problem Relation Age of Onset  . Hypertension Father   . Heart disease Father 73    AMI age 55; AMI x 3; stenting.  . Colon cancer Neg Hx       Objective:  There were no vitals filed for this visit. GEN: in no apparent distress and alert HEENT: anicteric Cardiac: Cor RRR and No murmurs Lungs: clear Abdomen: Bowel sounds are normal, liver is not enlarged, spleen is not enlarged Ext: peripheral pulses normal, no pedal edema, no clubbing or cyanosis Skin: negative for - jaundice, spider hemangioma, telangiectasia, palmar erythema, ecchymosis and atrophy Musculoskeletal:no joint swelling  Laboratory Genotype:  Lab Results  Component Value Date   HCVGENOTYPE 3 10/02/2014   HCV viral load:  Lab Results  Component Value Date   WBC 4.8 10/02/2014   HGB 15.0 10/02/2014   HCT 42.5 10/02/2014   MCV 85.2 10/02/2014   PLT 146* 10/02/2014    Lab Results  Component Value Date   CREATININE 0.84 10/02/2014   BUN 13 10/02/2014   NA 136 10/02/2014   K 4.9 10/02/2014   CL 103 10/02/2014   CO2 26 10/02/2014    Lab Results  Component Value Date   ALT 135*  10/02/2014   AST 87* 10/02/2014   ALKPHOS 68 10/02/2014   BILITOT 0.8 10/02/2014     Assessment: Chronic Hepatitis C genotype 3.  I discussed with the patient the natural history and progression of chronic hepatitis C infection including about 30% of people who develop cirrhosis of the liver and once cirrhosis is established there is a 2-7% risk per year of liver cancer and liver failure.  I discussed the importance of treatment and benefits in reducing the risk.   Plan: 1) Patient counseled extensively on limiting acetaminophen to no more than 2 grams daily, avoidance of alcohol. 2) Transmission discussed with patient  including sexual transmission, sharing razors and toothbrush.   3) Will need referral to gastroenterology if elastography F4 or signs of cirrhosis 4) Will need referral for substance abuse counseling: No. 5) Will prescribe Epclusa or sofosbuvir 400 mg daily and daclatasvir 60 mg daily for 12 weeks.  Counseled the patient on side effects and monitoring.   6) Follow up after elastography.   7) he feels he will be able to not take ppi during treatment.

## 2014-11-04 NOTE — Addendum Note (Signed)
Addended by: Myrtis Hopping A on: 11/04/2014 10:59 AM   Modules accepted: Orders

## 2014-11-13 ENCOUNTER — Encounter: Payer: Self-pay | Admitting: Gastroenterology

## 2014-11-13 ENCOUNTER — Ambulatory Visit (AMBULATORY_SURGERY_CENTER): Payer: Commercial Managed Care - PPO | Admitting: Gastroenterology

## 2014-11-13 VITALS — BP 113/80 | HR 60 | Temp 95.7°F | Resp 29 | Ht 70.0 in | Wt 221.0 lb

## 2014-11-13 DIAGNOSIS — Z86010 Personal history of colonic polyps: Secondary | ICD-10-CM

## 2014-11-13 DIAGNOSIS — D124 Benign neoplasm of descending colon: Secondary | ICD-10-CM

## 2014-11-13 DIAGNOSIS — D128 Benign neoplasm of rectum: Secondary | ICD-10-CM

## 2014-11-13 DIAGNOSIS — K635 Polyp of colon: Secondary | ICD-10-CM | POA: Diagnosis not present

## 2014-11-13 DIAGNOSIS — K621 Rectal polyp: Secondary | ICD-10-CM | POA: Diagnosis not present

## 2014-11-13 MED ORDER — SODIUM CHLORIDE 0.9 % IV SOLN: 500.0000 mL | INTRAVENOUS | Status: DC

## 2014-11-13 NOTE — Progress Notes (Signed)
Called to room to assist during endoscopic procedure.  Patient ID and intended procedure confirmed with present staff. Received instructions for my participation in the procedure from the performing physician.  

## 2014-11-13 NOTE — Op Note (Signed)
[facility]  [address]. [place], [id number]   COLONOSCOPY PROCEDURE REPORT  PATIENT: Joe, Allen  MR#: [id number] BIRTHDATE: 1955-12-08 , 22  yrs. old GENDER: male ENDOSCOPIST: Yetta Flock, MD REFERRED BY: Rikki Spearing MD PROCEDURE DATE:  11/13/2014 PROCEDURE:   Colonoscopy, surveillance and Colonoscopy with biopsy First Screening Colonoscopy - Avg.  risk and is 50 yrs.  old or older - No.  Prior Negative Screening - Now for repeat screening. N/A  History of Adenoma - Now for follow-up colonoscopy & has been > or = to 3 yrs.  Yes hx of adenoma.  Has been 3 or more years since last colonoscopy.  Polyps removed today? Yes ASA CLASS:   Class II INDICATIONS:Surveillance due to prior colonic neoplasia and Colorectal Neoplasm Risk Assessment for this procedure is average risk. MEDICATIONS: Propofol 300 mg IV  DESCRIPTION OF PROCEDURE:   After the risks benefits and alternatives of the procedure were thoroughly explained, informed consent was obtained.  The digital rectal exam revealed external hemorrhoids.   The LB [id number]  endoscope was introduced through the anus and advanced to the cecum, which was identified by both the appendix and ileocecal valve. No adverse events experienced.   The quality of the prep was good.  The instrument was then slowly withdrawn as the colon was fully examined. Estimated blood loss is zero unless otherwise noted in this procedure report.   COLON FINDINGS: A sessile polyp measuring 3 mm in size was found in the descending colon.  A polypectomy was performed with cold forceps.  The resection was complete, the polyp tissue was completely retrieved and sent to histology.   Six flat polyps ranging from 3 to 52mm in size were found in the rectum. Polypectomies were performed with cold forceps.  The resection was complete, the polyp tissue was completely retrieved and sent to histology.   There was mild diverticulosis  noted in the sigmoid colon.   The examination was otherwise normal.  Retroflexed views revealed large internal hemorrhoids. The time to cecum = 2.2 Withdrawal time = 18.3   The scope was withdrawn and the procedure completed. COMPLICATIONS: There were no immediate complications.     ENDOSCOPIC IMPRESSION: 1.   Sessile polyp was found in the descending colon; polypectomy was performed with cold forceps 2.   Six flat polyps ranging from 3 to 45mm in size were found in the rectum; polypectomies were performed with cold forceps 3.   Mild diverticulosis was noted in the sigmoid colon 4.   The examination was otherwise normal 5.   Internal and external hemorrhoids  RECOMMENDATIONS: 1.  Await pathology results 2.  Resume diet 3.  Resume medications 4.  Fiber supplement for hemorrhoids noted on this exam.  If you wish to have further therapy please call our clinic to consider banding therapy.  eSigned:  Yetta Flock, MD 11/13/2014 8:36 AM   cc: Rikki Spearing MD, the patient   PATIENT NAME:  Joe, Allen MR#: [id number]

## 2014-11-13 NOTE — Patient Instructions (Signed)
YOU HAD AN ENDOSCOPIC PROCEDURE TODAY AT [facility]:   Refer to the procedure report that was given to you for any specific questions about what was found during the examination.  If the procedure report does not answer your questions, please call your gastroenterologist to clarify.  If you requested that your care partner not be given the details of your procedure findings, then the procedure report has been included in a sealed envelope for you to review at your convenience later.  YOU SHOULD EXPECT: Some feelings of bloating in the abdomen. Passage of more gas than usual.  Walking can help get rid of the air that was put into your GI tract during the procedure and reduce the bloating. If you had a lower endoscopy (such as a colonoscopy or flexible sigmoidoscopy) you may notice spotting of blood in your stool or on the toilet paper. If you underwent a bowel prep for your procedure, you may not have a normal bowel movement for a few days.  Please Note:  You might notice some irritation and congestion in your nose or some drainage.  This is from the oxygen used during your procedure.  There is no need for concern and it should clear up in a day or so.  SYMPTOMS TO REPORT IMMEDIATELY:   Following lower endoscopy (colonoscopy or flexible sigmoidoscopy):  Excessive amounts of blood in the stool  Significant tenderness or worsening of abdominal pains  Swelling of the abdomen that is new, acute  Fever of 100F or higher    For urgent or emergent issues, a gastroenterologist can be reached at any hour by calling [phone].   DIET: Your first meal following the procedure should be a small meal and then it is ok to progress to your normal diet. Heavy or fried foods are harder to digest and may make you feel nauseous or bloated.  Likewise, meals heavy in dairy and vegetables can increase bloating.  Drink plenty of fluids but you should avoid alcoholic beverages for 24  hours.  ACTIVITY:  You should plan to take it easy for the rest of today and you should NOT DRIVE or use heavy machinery until tomorrow (because of the sedation medicines used during the test).    FOLLOW UP: Our staff will call the number listed on your records the next business day following your procedure to check on you and address any questions or concerns that you may have regarding the information given to you following your procedure. If we do not reach you, we will leave a message.  However, if you are feeling well and you are not experiencing any problems, there is no need to return our call.  We will assume that you have returned to your regular daily activities without incident.  If any biopsies were taken you will be contacted by phone or by letter within the next 1-3 weeks.  Please call us at [phone] if you have not heard about the biopsies in 3 weeks.    SIGNATURES/CONFIDENTIALITY: You and/or your care partner have signed paperwork which will be entered into your electronic medical record.  These signatures attest to the fact that that the information above on your After Visit Summary has been reviewed and is understood.  Full responsibility of the confidentiality of this discharge information lies with you and/or your care-partner.    Information on polyps,diverticulosis ,hemorrhoids and high fiber diet given to you today

## 2014-11-13 NOTE — Progress Notes (Signed)
Transferred to recovery room. A/O x3, pleased with MAC.  VSS.  Report to Penny, RN. 

## 2014-11-14 ENCOUNTER — Telehealth: Payer: Self-pay | Admitting: *Deleted

## 2014-11-14 NOTE — Telephone Encounter (Signed)
Patient wife returned phone call. She states that we need to call patient's phone #. [phone]

## 2014-11-14 NOTE — Telephone Encounter (Signed)
Message left

## 2014-11-19 ENCOUNTER — Encounter: Payer: Self-pay | Admitting: Gastroenterology

## 2014-11-20 ENCOUNTER — Ambulatory Visit (HOSPITAL_COMMUNITY)
Admission: RE | Admit: 2014-11-20 | Discharge: 2014-11-20 | Disposition: A | Discharge status: Home / Self Care | Payer: Commercial Managed Care - PPO | Source: Ambulatory Visit | Attending: Internal Medicine | Admitting: Internal Medicine

## 2014-11-20 DIAGNOSIS — I10 Essential (primary) hypertension: Secondary | ICD-10-CM | POA: Diagnosis not present

## 2014-11-20 DIAGNOSIS — R932 Abnormal findings on diagnostic imaging of liver and biliary tract: Secondary | ICD-10-CM | POA: Diagnosis not present

## 2014-11-20 DIAGNOSIS — B182 Chronic viral hepatitis C: Secondary | ICD-10-CM | POA: Insufficient documentation

## 2014-11-25 ENCOUNTER — Other Ambulatory Visit: Payer: Self-pay | Admitting: Pharmacist Clinician (PhC)/ Clinical Pharmacy Specialist

## 2014-12-09 ENCOUNTER — Telehealth: Payer: Self-pay

## 2014-12-09 DIAGNOSIS — M25511 Pain in right shoulder: Secondary | ICD-10-CM

## 2014-12-09 DIAGNOSIS — I10 Essential (primary) hypertension: Secondary | ICD-10-CM

## 2014-12-09 DIAGNOSIS — N529 Male erectile dysfunction, unspecified: Secondary | ICD-10-CM

## 2014-12-09 DIAGNOSIS — Z113 Encounter for screening for infections with a predominantly sexual mode of transmission: Secondary | ICD-10-CM

## 2014-12-09 DIAGNOSIS — Z13220 Encounter for screening for lipoid disorders: Secondary | ICD-10-CM

## 2014-12-09 DIAGNOSIS — Z86010 Personal history of colonic polyps: Secondary | ICD-10-CM

## 2014-12-09 DIAGNOSIS — R945 Abnormal results of liver function studies: Secondary | ICD-10-CM

## 2014-12-09 MED ORDER — LISINOPRIL 20 MG PO TABS: 20.0000 mg | ORAL_TABLET | Freq: Every day | ORAL | Status: DC

## 2014-12-09 NOTE — Telephone Encounter (Signed)
Pt states he is out of his LISINOPRIL 20 MG. Please call [phone]      Seba Dalkai

## 2014-12-09 NOTE — Telephone Encounter (Signed)
Rx sent.  Pt notified. 

## 2014-12-10 ENCOUNTER — Telehealth: Payer: Self-pay | Admitting: Pharmacy Technician

## 2014-12-10 NOTE — Telephone Encounter (Signed)
Tried Joe Allen several times with no answer.  Called [facility] and the medication - Epclusa, has not been mailed to him because they need to confirm his address and have not been able to reach him.  They have called twice and will try again around the 25th. He needs to call them at [phone]

## 2014-12-11 ENCOUNTER — Encounter: Payer: Self-pay | Admitting: Pharmacy Technician

## 2014-12-11 ENCOUNTER — Ambulatory Visit: Payer: Commercial Managed Care - PPO | Admitting: Pharmacist Clinician (PhC)/ Clinical Pharmacy Specialist

## 2014-12-11 NOTE — Progress Notes (Signed)
Patient ID: Bronco Stem, male   DOB: [birth year], 59 y.o.   MRN: [id number]  HPI: Elai Farrer is a 59 y.o. male with chronic hepatitis C. He has not yet picked up his medication.  Lab Results  Component Value Date   HCVGENOTYPE 3 10/02/2014    Allergies: Allergies  Allergen Reactions  . Codeine Other (See Comments)     PER PATIENT GIVES UPSET STOMACH    Vitals:    Past Medical History: Past Medical History  Diagnosis Date  . Hypertension     Social History: Social History   Social History  . Marital Status: Married    Spouse Name: N/A  . Number of Children: N/A  . Years of Education: N/A   Social History Main Topics  . Smoking status: Former Smoker    Quit date: 02/01/2010  . Smokeless tobacco: Never Used  . Alcohol Use: No     Comment: occass  . Drug Use: No  . Sexual Activity: Yes   Other Topics Concern  . Not on file   Social History Narrative   Marital status: married x 5 years; third marriage      Children: two children; one Psychiatrist; two grandchildren.      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      Drugs: none      Exercise: walking daily; gym three times per week.    Labs: HEPATITIS B SURFACE AG (no units)  Date Value  09/02/2014 NEGATIVE   HCV AB (no units)  Date Value  09/02/2014 REACTIVE*    Lab Results  Component Value Date   HCVGENOTYPE 3 10/02/2014    Hepatitis C RNA quantitative Latest Ref Rng 09/02/2014  HCV Quantitative <15 IU/mL 677311(H)  HCV Quantitative Log <1.18 log 10 5.83(H)    AST (U/L)  Date Value  10/02/2014 87*  09/02/2014 101*  11/29/2013 56*   ALT (U/L)  Date Value  10/02/2014 135*  09/02/2014 171*  11/29/2013 93*   INR (no units)  Date Value  10/02/2014 1.04    CrCl: CrCl cannot be calculated (Unknown ideal weight.).  Fibrosis Score: F3/F4 as assessed by elastography    Child-Pugh Score: A  Previous Treatment Regimen: None  Assessment: Mr. Soderman is a 59 year old male who presents for hepatitis C follow up. He has not yet received his Epclusa from mail order due to them not being able to get in contact with him. We updated his contact information to include his landline. He states he has bad cell reception at his house, so please use his home number as an alternative contact.  The patient spoke with his mail order pharmacy last night and states that they said his Raeanne Gathers should arrive within the week. He will call the clinic and let us know his start date. He was instructed to take the medication at the same time everyday and to not miss any doses. He currently takes Prilosec daily. He states he knows not to use this while on Epclusa. I instructed the patient to call the clinic prior to starting any new medications or OTC products.  Recommendations: - Begin Epclusa 1 tablet daily - Patient will call with start date and can schedule pharmacist visit at that time - Follow up with pharmacist ~2-3 weeks after starting Valerie Roys  Effie Shy, PharmD. PGY-1 Pharmacy Resident Pager: [phone] 12/11/2014, 9:40 AM

## 2014-12-17 ENCOUNTER — Telehealth: Payer: Self-pay | Admitting: Pharmacist Clinician (PhC)/ Clinical Pharmacy Specialist

## 2014-12-17 NOTE — Telephone Encounter (Signed)
Joe Allen called to say that he started [place] on 11/19. Told him to come in for his 2 wk appt on 12/5 at 0900.

## 2014-12-28 MED ORDER — SOFOSBUVIR-VELPATASVIR 400-100 MG PO TABS: 1.0000 | ORAL_TABLET | Freq: Every day | ORAL | Status: DC

## 2014-12-29 ENCOUNTER — Ambulatory Visit: Payer: Commercial Managed Care - PPO | Admitting: Pharmacist Clinician (PhC)/ Clinical Pharmacy Specialist

## 2014-12-29 DIAGNOSIS — B182 Chronic viral hepatitis C: Secondary | ICD-10-CM

## 2014-12-29 NOTE — Progress Notes (Signed)
Patient ID: Nader Ippolito, male   DOB: 06/27/1955, 59 y.o.   MRN: [id number] HPI: Gaige Behrmann is a 59 y.o. male who is here for his 2 wks f/u  Lab Results  Component Value Date   HCVGENOTYPE 3 10/02/2014    Allergies: Allergies  Allergen Reactions  . Codeine Other (See Comments)     PER PATIENT GIVES UPSET STOMACH    Vitals:    Past Medical History: Past Medical History  Diagnosis Date  . Hypertension     Social History: Social History   Social History  . Marital Status: Married    Spouse Name: N/A  . Number of Children: N/A  . Years of Education: N/A   Social History Main Topics  . Smoking status: Former Smoker    Quit date: 02/01/2010  . Smokeless tobacco: Never Used  . Alcohol Use: No     Comment: occass  . Drug Use: No  . Sexual Activity: Yes   Other Topics Concern  . Not on file   Social History Narrative   Marital status: married x 5 years; third marriage      Children: two children; one Psychiatrist; two grandchildren.      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      Drugs: none      Exercise: walking daily; gym three times per week.    Labs: HEPATITIS B SURFACE AG (no units)  Date Value  09/02/2014 NEGATIVE   HCV AB (no units)  Date Value  09/02/2014 REACTIVE*    Lab Results  Component Value Date   HCVGENOTYPE 3 10/02/2014    Hepatitis C RNA quantitative Latest Ref Rng 09/02/2014  HCV Quantitative <15 IU/mL 677311(H)  HCV Quantitative Log <1.18 log 10 5.83(H)    AST (U/L)  Date Value  10/02/2014 87*  09/02/2014 101*  11/29/2013 56*   ALT (U/L)  Date Value  10/02/2014 135*  09/02/2014 171*  11/29/2013 93*   INR (no units)  Date Value  10/02/2014 1.04    CrCl: CrCl cannot be calculated (Unknown ideal weight.).  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 He has stopped his prilosec completely and rarely has symptoms now. He is religious about his meds so he has not missed any doses. No side effects. Scheduled him to come back in 2 wks for VL and he is going to see Dr. Jerl Santos in Jan.   Recommendations: Cont Epclusa 1 PO qday VL in 2 wks  Onnie Boer McMullin, [place].D., BCPS, AAHIVP Clinical Infectious [facility] [place] [facility] 12/29/2014, 9:19 AM

## 2015-01-01 ENCOUNTER — Telehealth: Payer: Self-pay | Admitting: *Deleted

## 2015-01-01 NOTE — Telephone Encounter (Signed)
Spoke with patient and he would like information about the procedure and then he will consider it. Mailed information to patient.

## 2015-01-12 ENCOUNTER — Other Ambulatory Visit: Payer: Commercial Managed Care - PPO

## 2015-01-12 DIAGNOSIS — B182 Chronic viral hepatitis C: Secondary | ICD-10-CM

## 2015-01-13 LAB — HEPATITIS C RNA QUANTITATIVE: HCV QUANT: NOT DETECTED [IU]/mL (ref <15)

## 2015-01-27 ENCOUNTER — Ambulatory Visit (INDEPENDENT_AMBULATORY_CARE_PROVIDER_SITE_OTHER): Payer: Commercial Managed Care - PPO | Admitting: Internal Medicine

## 2015-01-27 ENCOUNTER — Encounter: Payer: Self-pay | Admitting: Internal Medicine

## 2015-01-27 VITALS — BP 146/99 | HR 81 | Temp 98.2°F | Wt 223.0 lb

## 2015-01-27 DIAGNOSIS — R740 Nonspecific elevation of levels of transaminase and lactic acid dehydrogenase [LDH]: Secondary | ICD-10-CM

## 2015-01-27 DIAGNOSIS — B182 Chronic viral hepatitis C: Secondary | ICD-10-CM | POA: Diagnosis not present

## 2015-01-27 DIAGNOSIS — K7460 Unspecified cirrhosis of liver: Secondary | ICD-10-CM | POA: Insufficient documentation

## 2015-01-27 DIAGNOSIS — Z23 Encounter for immunization: Secondary | ICD-10-CM

## 2015-01-27 DIAGNOSIS — R7401 Elevation of levels of liver transaminase levels: Secondary | ICD-10-CM | POA: Insufficient documentation

## 2015-01-27 DIAGNOSIS — K7400 Hepatic fibrosis, unspecified: Secondary | ICD-10-CM

## 2015-01-27 NOTE — Assessment & Plan Note (Signed)
Platelets mildly decreased but otherwise no other lab abnormalities other than transaminitis.  Will refer to his GI for ? EGD. I will do [place] screen with liver ultrasound every 6 months. Next in April.

## 2015-01-27 NOTE — Assessment & Plan Note (Addendum)
Doing great on Epclusa.  Pleased with results.  Will be out of town in March so will do end of treatment lab in April and see me after lab.  Will then do ultrasound as well.  Hep B #2 today.  Hep A and B after next visit.

## 2015-01-27 NOTE — Assessment & Plan Note (Signed)
Likely from hepatitis C.  Will recheck next visit to see if resolved.

## 2015-01-27 NOTE — Progress Notes (Signed)
   Subjective:    Patient ID: Joe Allen, male    DOB: 1955/09/22, 60 y.o.   MRN: [id number]  HPI Here for follow up of HCV.  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 Undetectable virus after 1 month.  Some mild fatigue but no real problems.  Has been doing well off of ppi, getting used to not taking it.  Uses some baking soda occasionally.     Review of Systems  Constitutional: Positive for fatigue. Negative for unexpected weight change.  Skin: Negative for rash.  Neurological: Negative for headaches.  Psychiatric/Behavioral: Negative for sleep disturbance.       Objective:   Physical Exam  Constitutional: He appears well-developed and well-nourished. No distress.  Eyes: No scleral icterus.  Cardiovascular: Normal rate, regular rhythm and normal heart sounds.   No murmur heard. Pulmonary/Chest: Effort normal and breath sounds normal. No respiratory distress.  Skin: No rash noted.   Social History   Social History  . Marital Status: Married    Spouse Name: N/A  . Number of Children: N/A  . Years of Education: N/A   Occupational History  . Not on file.   Social History Main Topics  . Smoking status: Former Smoker    Quit date: 02/01/2010  . Smokeless tobacco: Never Used  . Alcohol Use: No     Comment: occass  . Drug Use: No  . Sexual Activity: Yes   Other Topics Concern  . Not on file   Social History Narrative   Marital status: married x 5 years; third marriage      Children: two children; one Psychiatrist; two grandchildren.      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      Drugs: none      Exercise: walking daily; gym three times per week.        Assessment & Plan:

## 2015-03-02 ENCOUNTER — Encounter: Payer: Self-pay | Admitting: Internal Medicine

## 2015-04-03 ENCOUNTER — Telehealth: Payer: Self-pay

## 2015-04-03 DIAGNOSIS — Z860100 Personal history of colon polyps, unspecified: Secondary | ICD-10-CM

## 2015-04-03 DIAGNOSIS — I10 Essential (primary) hypertension: Secondary | ICD-10-CM

## 2015-04-03 DIAGNOSIS — Z13220 Encounter for screening for lipoid disorders: Secondary | ICD-10-CM

## 2015-04-03 DIAGNOSIS — R945 Abnormal results of liver function studies: Secondary | ICD-10-CM

## 2015-04-03 DIAGNOSIS — M25511 Pain in right shoulder: Secondary | ICD-10-CM

## 2015-04-03 DIAGNOSIS — N529 Male erectile dysfunction, unspecified: Secondary | ICD-10-CM

## 2015-04-03 DIAGNOSIS — Z113 Encounter for screening for infections with a predominantly sexual mode of transmission: Secondary | ICD-10-CM

## 2015-04-03 NOTE — Telephone Encounter (Signed)
Patient states that he has to call and let Dr. Marin Comment know whenever he picks up his medication. He's picking up his refill for lisinopril today.

## 2015-04-06 MED ORDER — LISINOPRIL 20 MG PO TABS: 20.0000 mg | ORAL_TABLET | Freq: Every day | ORAL | Status: AC

## 2015-04-06 NOTE — Telephone Encounter (Signed)
Can you let him know that I have refilled his lisinopril for 9 days with 1 refill. He will need to come back to the office for refills before his 2nd refill runs out for lab work. He has to be seen at least once a year for medication refills from our office.

## 2015-04-06 NOTE — Telephone Encounter (Signed)
Spoke with patient and he will come in for a visit when he comes home from vacation.   He asked about Dr. Gus Puma schedule and I let him know that she will not be returning and he was ok with that.

## 2015-05-05 ENCOUNTER — Encounter: Payer: Self-pay | Admitting: Internal Medicine

## 2015-05-05 ENCOUNTER — Ambulatory Visit (INDEPENDENT_AMBULATORY_CARE_PROVIDER_SITE_OTHER): Payer: Commercial Managed Care - PPO | Admitting: Internal Medicine

## 2015-05-05 ENCOUNTER — Telehealth: Payer: Self-pay | Admitting: *Deleted

## 2015-05-05 VITALS — BP 122/81 | HR 80 | Temp 97.7°F | Ht 70.0 in | Wt 224.0 lb

## 2015-05-05 DIAGNOSIS — B182 Chronic viral hepatitis C: Secondary | ICD-10-CM

## 2015-05-05 DIAGNOSIS — K7460 Unspecified cirrhosis of liver: Secondary | ICD-10-CM | POA: Diagnosis not present

## 2015-05-05 DIAGNOSIS — I10 Essential (primary) hypertension: Secondary | ICD-10-CM | POA: Diagnosis not present

## 2015-05-05 DIAGNOSIS — Z23 Encounter for immunization: Secondary | ICD-10-CM

## 2015-05-05 LAB — COMPLETE METABOLIC PANEL WITH GFR
ALBUMIN: 4.5 g/dL (ref 3.6–5.1)
ALK PHOS: 39 U/L — AB (ref 40–115)
ALT: 20 U/L (ref 9–46)
AST: 18 U/L (ref 10–35)
BILIRUBIN TOTAL: 0.8 mg/dL (ref 0.2–1.2)
BUN: 13 mg/dL (ref 7–25)
CO2: 23 mmol/L (ref 20–31)
CREATININE: 0.75 mg/dL (ref 0.70–1.33)
Calcium: 9.8 mg/dL (ref 8.6–10.3)
Chloride: 102 mmol/L (ref 98–110)
GFR, Est African American: >89 mL/min (ref >=60)
GFR, Est Non African American: >89 mL/min (ref >=60)
GLUCOSE: 123 mg/dL — AB (ref 65–99)
Potassium: 4.8 mmol/L (ref 3.5–5.3)
SODIUM: 136 mmol/L (ref 135–146)
TOTAL PROTEIN: 7.6 g/dL (ref 6.1–8.1)

## 2015-05-05 NOTE — Addendum Note (Signed)
Addended by: Myrtis Hopping A on: 05/05/2015 12:04 PM   Modules accepted: Orders

## 2015-05-05 NOTE — Progress Notes (Signed)
   Subjective:    Patient ID: Joe Allen, male    DOB: [birth year], 60 y.o.   MRN: [id number]  HPI Here for follow up of HCV.  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 Undetectable virus after 1 month.  Has not yet been to GI.  Hepatitis A and B due today for final dose.  No weight loss, no diarrhea. No abdominal bloating.    Review of Systems  Constitutional: Negative for fatigue and unexpected weight change.  Skin: Negative for rash.  Neurological: Negative for headaches.  Psychiatric/Behavioral: Negative for sleep disturbance.       Objective:   Physical Exam  Constitutional: He appears well-developed and well-nourished. No distress.  Eyes: No scleral icterus.  Cardiovascular: Normal rate, regular rhythm and normal heart sounds.   No murmur heard. Pulmonary/Chest: Effort normal and breath sounds normal. No respiratory distress.  Skin: No rash noted.   Social History   Social History  . Marital Status: Married    Spouse Name: N/A  . Number of Children: N/A  . Years of Education: N/A   Occupational History  . Not on file.   Social History Main Topics  . Smoking status: Former Smoker    Quit date: 02/01/2010  . Smokeless tobacco: Never Used  . Alcohol Use: No     Comment: occass  . Drug Use: No  . Sexual Activity: Yes   Other Topics Concern  . Not on file   Social History Narrative   Marital status: married x 5 years; third marriage      Children: two children; one Psychiatrist; two grandchildren.      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      Drugs: none      Exercise: walking daily; gym three times per week.        Assessment & Plan:

## 2015-05-05 NOTE — Telephone Encounter (Signed)
Called patient and left a voice mail to inform him of appt for ultrasound on 05/13/15 at 9:00 AM at [facility]. Nothing to eat or drink after midnight. Asked that he call back to confirm he received this message. Myrtis Hopping

## 2015-05-05 NOTE — Assessment & Plan Note (Signed)
BP good

## 2015-05-05 NOTE — Assessment & Plan Note (Signed)
Will refer to GI for EGD. No alcohol.  Pneumovax today.  Will order [place] today

## 2015-05-06 LAB — HEPATITIS C RNA QUANTITATIVE: HCV QUANT: NOT DETECTED [IU]/mL (ref <15)

## 2015-05-07 ENCOUNTER — Telehealth: Payer: Self-pay | Admitting: *Deleted

## 2015-05-07 NOTE — Telephone Encounter (Signed)
Patient notified

## 2015-05-07 NOTE — Telephone Encounter (Signed)
-----   Message from Thayer Headings, MD sent at 05/07/2015  2:21 PM EDT ----- Please let him know his HCV remains gone and he is considered cured.  Thanks

## 2015-05-13 ENCOUNTER — Other Ambulatory Visit: Payer: Self-pay | Admitting: Internal Medicine

## 2015-05-13 ENCOUNTER — Ambulatory Visit
Admission: RE | Admit: 2015-05-13 | Discharge: 2015-05-13 | Disposition: A | Discharge status: Home / Self Care | Payer: Commercial Managed Care - PPO | Source: Ambulatory Visit | Attending: Internal Medicine | Admitting: Internal Medicine

## 2015-05-13 ENCOUNTER — Telehealth: Payer: Self-pay | Admitting: *Deleted

## 2015-05-13 DIAGNOSIS — K7460 Unspecified cirrhosis of liver: Secondary | ICD-10-CM

## 2015-05-13 DIAGNOSIS — R160 Hepatomegaly, not elsewhere classified: Secondary | ICD-10-CM

## 2015-05-13 NOTE — Telephone Encounter (Signed)
-----   Message from Thayer Headings, MD sent at 05/13/2015 12:11 PM EDT ----- Please schedule the MRI of liver and have him come see me after it is done.  thanks

## 2015-05-15 ENCOUNTER — Other Ambulatory Visit: Payer: Self-pay | Admitting: Internal Medicine

## 2015-05-15 ENCOUNTER — Ambulatory Visit (HOSPITAL_COMMUNITY)
Admission: RE | Admit: 2015-05-15 | Discharge: 2015-05-15 | Disposition: A | Discharge status: Home / Self Care | Payer: Commercial Managed Care - PPO | Source: Ambulatory Visit | Attending: Internal Medicine | Admitting: Internal Medicine

## 2015-05-15 ENCOUNTER — Telehealth: Payer: Self-pay | Admitting: *Deleted

## 2015-05-15 DIAGNOSIS — C220 Liver cell carcinoma: Secondary | ICD-10-CM

## 2015-05-15 DIAGNOSIS — D1803 Hemangioma of intra-abdominal structures: Secondary | ICD-10-CM | POA: Insufficient documentation

## 2015-05-15 DIAGNOSIS — K7689 Other specified diseases of liver: Secondary | ICD-10-CM | POA: Diagnosis not present

## 2015-05-15 DIAGNOSIS — R160 Hepatomegaly, not elsewhere classified: Secondary | ICD-10-CM | POA: Diagnosis present

## 2015-05-15 MED ORDER — GADOXETATE DISODIUM 0.25 MMOL/ML IV SOLN
10.0000 mL | Freq: Once | INTRAVENOUS | Status: AC | PRN
  Administered 2015-05-15: 10 mL via INTRAVENOUS

## 2015-05-15 NOTE — Telephone Encounter (Signed)
Dr. Linus Salmons has called patient and gave him the results. He is aware of a referral to oncology. Joe Allen

## 2015-05-15 NOTE — Telephone Encounter (Signed)
Call from Radiology to let Dr. Linus Salmons know that the MRI results are in EPIC. Referral has been made to [facility] and their referral specialist has marked it as Urgent. Please let me know if you want me to notify patient that this referral is in process. Thanks Myrtis Hopping

## 2015-05-15 NOTE — Telephone Encounter (Signed)
Oncology Nurse Navigator Documentation  Oncology Nurse Navigator Flowsheets 05/15/2015  Navigator Location [facility]  Navigator Encounter Type Introductory phone call  Abnormal Finding Date 05/13/2015  Confirmed Diagnosis Date 05/14/2015  Called patient to make him aware that referral has been received. His chart info is on Dr. Ernestina Penna desk for review. I will call him the first of next week with an appointment. Expressed appreciation for the call.

## 2015-05-18 ENCOUNTER — Other Ambulatory Visit: Payer: Commercial Managed Care - PPO

## 2015-05-18 ENCOUNTER — Telehealth: Payer: Self-pay | Admitting: *Deleted

## 2015-05-18 ENCOUNTER — Encounter: Payer: Self-pay | Admitting: *Deleted

## 2015-05-18 ENCOUNTER — Other Ambulatory Visit: Payer: Self-pay | Admitting: Internal Medicine

## 2015-05-18 DIAGNOSIS — R160 Hepatomegaly, not elsewhere classified: Secondary | ICD-10-CM

## 2015-05-18 NOTE — Telephone Encounter (Signed)
  Oncology Nurse Navigator Documentation  Navigator Location: [facility] (05/18/15 1548) Navigator Encounter Type: Telephone (05/18/15 1548) Telephone: [place] Call (05/18/15 1548)  Left VM for patient for appointment with Dr. Burr Medico on 05/21/15 at 2:30 pm with request he call back to confirm. He called back and left message that he can't retrieve his VM. Please call wife with the information. Called back and provided wife the appointment and she agrees they will be here.

## 2015-05-18 NOTE — Telephone Encounter (Signed)
Patient notified and he will come in today for this lab test.

## 2015-05-18 NOTE — Telephone Encounter (Signed)
Pt called and left message re:  Pt's voice mail is not working properly at present; pt cannot receive any messages.  Pt gave phone permission for office to call wife with any information. Wife's   Cell       [phone].

## 2015-05-18 NOTE — Telephone Encounter (Signed)
Could you also have him come in this week and get an AFP blood test?  I put the order in. thanks

## 2015-05-19 LAB — AFP TUMOR MARKER: AFP-Tumor Marker: 5.9 ng/mL (ref <6.1)

## 2015-05-21 ENCOUNTER — Encounter: Payer: Self-pay | Admitting: Hematology

## 2015-05-21 ENCOUNTER — Ambulatory Visit (HOSPITAL_BASED_OUTPATIENT_CLINIC_OR_DEPARTMENT_OTHER): Payer: Commercial Managed Care - PPO | Admitting: Hematology

## 2015-05-21 ENCOUNTER — Encounter: Payer: Self-pay | Admitting: *Deleted

## 2015-05-21 VITALS — BP 138/76 | HR 103 | Temp 99.1°F | Resp 18 | Ht 70.0 in | Wt 222.7 lb

## 2015-05-21 DIAGNOSIS — Z87891 Personal history of nicotine dependence: Secondary | ICD-10-CM

## 2015-05-21 DIAGNOSIS — Z87898 Personal history of other specified conditions: Secondary | ICD-10-CM | POA: Diagnosis not present

## 2015-05-21 DIAGNOSIS — C220 Liver cell carcinoma: Secondary | ICD-10-CM | POA: Diagnosis not present

## 2015-05-21 DIAGNOSIS — N289 Disorder of kidney and ureter, unspecified: Secondary | ICD-10-CM

## 2015-05-21 NOTE — Patient Instructions (Signed)
   Care Plan Summary- 05/21/2015 Name:  Joe Allen    DOB: 1955/04/25 Your Medical Team:  Medical Oncologist:  Dr. 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   Recommendations: Based on information available as of today's consult. Recommendations may change depending on the results of further tests or exams. 1) 1st choice-referral to Dr. Grafton Folk is #1 treatment if tumor is resectable 2) 2nd choice-liver transplant (would need embolization to reduce size first to < 5cm) 3) 3rd-Ablation-tumor is too large for this 4) Chemotherapy not indicated at this time-reserved for advanced disease ______________________________________________________________________________ Next Steps: 1) Referral to surgery 2) Case discussion in GI Tumor Board on 5/3 and we will call you afterwards 3) Follow up with Dr. Burr Medico after surgery and alternate visits w/Dr. Linus Salmons 4) Continue MRI every 6 months (3 months after embolization procedure) Questions? Merceda Elks, RN, BSN at [phone]. Manuela Schwartz is your Oncology Nurse Navigator and is available to assist you while you're receiving your medical care at [facility].

## 2015-05-21 NOTE — Addendum Note (Signed)
Addended by: Truitt Merle on: 05/21/2015 04:57 PM   Modules accepted: Level of Service

## 2015-05-21 NOTE — Progress Notes (Signed)
Oncology Nurse Navigator Documentation  Oncology Nurse Navigator Flowsheets 05/21/2015  Navigator Location [facility]  Navigator Encounter Type Initial MedOnc  Telephone -  Abnormal Finding Date -  Confirmed Diagnosis Date -  Patient Visit Type MedOnc;Initial  Treatment Phase Pre-Tx/Tx Discussion  Barriers/Navigation Needs Education  Education Accessing Care/ Finding Providers;Understanding Cancer/ Treatment Options;Coping with Diagnosis/ Prognosis  Interventions Coordination of Care--added to tumor board discussion for 5/3 and message to Dr. Marlowe Aschoff nurse for referral  Coordination of Care Appts;Other  Support Groups/Services GI Support Group;Other--[place] support services and financial assistance contacts  Acuity Level 2  Time Spent with Patient 60  Met with patient and wife, Gwenette Greet during new patient visit. Explained the role of the GI Nurse Navigator and provided New Patient Packet with information on: 1. Liver cancer 2. Support groups 3. Advanced Directives 4. Fall Safety Plan Answered questions, reviewed current treatment plan using TEACH back and provided emotional support. Provided copy of current treatment plan. Patient requested nurse call their friend, Charlann Lange who is a PA in emergency room with the appointment discussion. Called [phone] and left VM to return call to [facility] as requested by patient and his wife.  Merceda Elks, RN, BSN GI Oncology [place]

## 2015-05-21 NOTE — Progress Notes (Signed)
[facility]  [phone] [phone]  Clinic New Consult Note   Patient Care Team: Pcp Not In System as PCP - General Joe Headings, MD as Consulting Physician (Infectious Diseases) Joe Merle, MD as Consulting Physician (Hematology) Joe Gunning, MD as Consulting Physician (Gastroenterology) 05/21/2015  REFERRAL PHYSICIAN: Dr. Linus Allen   CHIEF COMPLAINTS/PURPOSE OF CONSULTATION:  Newly diagnosed Joe Allen   HISTORY OF PRESENTING ILLNESS:  Joe Allen 60 y.o. male is here because of his newly diagnosed hepatocellular carcinoma. He was referred by his infection disease Dr. Linus Allen.   He used to use recreational drugs and drink alcohol heavily in the past, but has been clean for many years. He had a routine follow-up with his primary care physician Dr. Marin Allen in 08/2014, and lab tests showed abnormal liver function. Further work up discovered hepatitis C infection, and HIV was negative. He was referred to see infection disease specialist Dr. Linus Allen in October 2016. He started HCV therapy in Nov 2016, and completed 3 months therapy. His posttreatment test showed HCV was undetectable, he overall tolerated treatment overall well.  His routine ultrasound of abdomen screening was negative in October 2016, however routine ultrasound in April 2017 showed a 5.3 x 4.0 x 3.64 cm lobulated mass in the right hepatic lobe. Liver MRI scan was obtained on 05/15/2015, which showed heterogeneously enhancing 5.8 cm mass in the normal the right hepatic lobe, consistent with HCC.  He feels well overall, denies any pain, dyspnea, bloating, or other symptoms. Since the liver MRI, he has noticed some moderate discomfort in the right mid abdomen, not pain. He has a very good appetite and and energy level, has gained some weight in the past 2 years. He is married, works for [employer].  MEDICAL HISTORY:  Past Medical History  Diagnosis Date  .  Hypertension   . Chronic hepatitis C ([place])   . Hypertension     SURGICAL HISTORY: Past Surgical History  Procedure Laterality Date  . Colonoscopy  01/24/2010  . Polypectomy  2009    SOCIAL HISTORY: Social History   Social History  . Marital Status: Married    Spouse Name: N/A  . Number of Children: N/A  . Years of Education: N/A   Occupational History  . Not on file.   Social History Main Topics  . Smoking status: Former Smoker    Quit date: 02/01/2010  . Smokeless tobacco: Never Used  . Alcohol Use: No     Allen: occass  . Drug Use: No  . Sexual Activity: Yes   Other Topics Concern  . Not on file   Social History Narrative   Marital status: married x 5 years; third marriage      Children: two children; one Psychiatrist; two grandchildren.      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      Drugs: none      Exercise: walking daily; gym three times per week.    FAMILY HISTORY: Family History  Problem Relation Age of Onset  . Hypertension Father   . Heart disease Father 26    AMI age 33; AMI x 3; stenting.  . Colon cancer Neg Hx     ALLERGIES:  is allergic to codeine.  MEDICATIONS:  Current Outpatient Prescriptions  Medication Sig Dispense Refill  . lisinopril (PRINIVIL,ZESTRIL) 20 MG tablet Take 1 tablet (20 mg total) by mouth daily. 90 tablet 1  . omeprazole (PRILOSEC) 20 MG capsule Take 20 mg by mouth daily.    . sildenafil (VIAGRA) 100 MG tablet Take 0.5-1 tablets (50-100 mg total) by mouth daily as needed for erectile dysfunction. 5 tablet 11   No current facility-administered medications for this visit.    REVIEW OF SYSTEMS:   Constitutional: Denies fevers, chills or abnormal night sweats Eyes: Denies blurriness of vision, double vision or watery eyes Ears, nose, mouth, throat, and face: Denies mucositis or  sore throat Respiratory: Denies cough, dyspnea or wheezes Cardiovascular: Denies palpitation, chest discomfort or lower extremity swelling Gastrointestinal:  Denies nausea, heartburn or change in bowel habits Skin: Denies abnormal skin rashes Lymphatics: Denies new lymphadenopathy or easy bruising Neurological:Denies numbness, tingling or new weaknesses Behavioral/Psych: Mood is stable, no new changes  All other systems were reviewed with the patient and are negative.  PHYSICAL EXAMINATION: ECOG PERFORMANCE STATUS: 0 - Asymptomatic  Filed Vitals:   05/21/15 1454  BP: 138/76  Pulse: 103  Temp: 99.1 F (37.3 C)  Resp: 18   Filed Weights   05/21/15 1454  Weight: 222 lb 11.2 oz (101.016 kg)    GENERAL:alert, no distress and comfortable SKIN: skin color, texture, turgor are normal, no rashes or significant lesions EYES: normal, conjunctiva are pink and non-injected, sclera clear OROPHARYNX:no exudate, no erythema and lips, buccal mucosa, and tongue normal  NECK: supple, thyroid normal size, non-tender, without nodularity LYMPH:  no palpable lymphadenopathy in the cervical, axillary or inguinal LUNGS: clear to auscultation and percussion with normal breathing effort HEART: regular rate & rhythm and no murmurs and no lower extremity edema ABDOMEN:abdomen soft, non-tender and normal bowel sounds, no organomegaly Musculoskeletal:no cyanosis of digits and no clubbing  PSYCH: alert & oriented x 3 with fluent speech NEURO: no focal motor/sensory deficits  LABORATORY DATA:  I have reviewed the data as listed CBC Latest Ref Rng 10/02/2014 09/02/2014 11/14/2007  WBC 4.0 - 10.5 K/uL 4.8 5.0 -  Hemoglobin 13.0 - 17.0 g/dL 15.0 15.1 15.3  Hematocrit 39.0 - 52.0 % 42.5 47.3 45.0  Platelets 150 - 400 K/uL 146(L) - -    CMP Latest Ref Rng 05/05/2015 10/02/2014 09/02/2014  Glucose 65 - 99 mg/dL 123(H) 103(H) 103(H)  BUN 7 - 25 mg/dL 13 13 8   Creatinine 0.70 - 1.33 mg/dL 0.75 0.84 0.74  Sodium  135 - 146 mmol/L 136 136 139  Potassium 3.5 - 5.3 mmol/L 4.8 4.9 4.7  Chloride 98 - 110 mmol/L 102 103 103  CO2 20 - 31 mmol/L 23 26 28   Calcium 8.6 - 10.3 mg/dL 9.8 9.4 9.8  Total Protein 6.1 - 8.1 g/dL 7.6 8.0 8.3(H)  Total Bilirubin 0.2 - 1.2 mg/dL 0.8 0.8 0.7  Alkaline Phos 40 - 115 U/L 39(L) 68 69  AST 10 - 35 U/L 18 87(H) 101(H)  ALT 9 - 46 U/L 20 135(H) 171(H)     Results for ERASTUS, NICASIO (MRN [id number]) as of 05/21/2015 08:30  Ref. Range 05/18/2015 16:50  AFP Tumor Marker Latest Ref Range: <6.1 ng/mL 5.9    RADIOGRAPHIC STUDIES: I have personally reviewed the radiological images as listed and agreed with the findings in the report. Mr Liver W Wo Contrast  05/15/2015  CLINICAL DATA:  Liver mass on ultrasound. History of hepatitis A and C. EXAM: MRI ABDOMEN WITHOUT AND WITH CONTRAST TECHNIQUE: Multiplanar multisequence MR  imaging of the abdomen was performed both before and after the administration of intravenous contrast. CONTRAST:  10 ml Eovist. COMPARISON:  Ultrasound 11/20/2014 and 05/13/2015) FINDINGS: Lower chest:  Unremarkable. Hepatobiliary: There are morphologic changes of cirrhosis with mild contour irregularity of the liver and relative enlargement of the caudate lobe. The lesion of concern in the dome of the right hepatic lobe on ultrasound involves segments 7 and 8 and is in close proximity to the right hepatic vein. This lesion measures 5.8 x 5.2 x 3.9 cm. It demonstrates heterogeneous T2 signal, restricted diffusion and heterogeneous arterial phase contrast. It is hypo intense to liver on the delayed hepatocyte phase images. There is a 10 mm T2 hyperintense lesion in the lateral segment of the left hepatic lobe which appears to demonstrate mild peripheral discontinuous enhancement following contrast. This is difficult to optimally characterize based on size, although is probably a hemangioma. There are several other tiny T2 hyperintense lesions consistent with cysts. There  is a 1.7 cm area of nodular enhancement anteriorly in the left hepatic lobe (image 25 of series 601) without corresponding T2 signal abnormality or abnormality on delayed imaging, consistent with a transient vascular phenomenon. No evidence of gallstones, gallbladder wall thickening or biliary dilatation. Pancreas: Unremarkable. No pancreatic ductal dilatation or surrounding inflammatory changes. Spleen: Normal in size without focal abnormality. Adrenals/Urinary Tract: Both adrenal glands appear normal. There is a heterogeneous mass in the upper pole the right kidney, measuring 2.7 x 2.4 cm. This demonstrates heterogeneous T2 signal and heterogeneous enhancement following contrast, most consistent with renal cell carcinoma. No other suspicious renal findings. There is a small simple cyst medially in the upper pole of the right kidney. Stomach/Bowel: No evidence of bowel wall thickening, distention or surrounding inflammatory change.Suggested gastric wall thickening, likely due to incomplete distension. Vascular/Lymphatic: There are no enlarged abdominal lymph nodes. No significant vascular findings are present. The hepatic and portal veins appear patent. No significant varices are seen. Other: No evidence of ascites. Musculoskeletal: No acute or significant osseous findings. IMPRESSION: 1. Heterogeneously enhancing 5.8 cm mass in the dome of the right hepatic lobe (segment 7 and 8). Imaging findings in the setting of cirrhosis/chronic hepatitis are diagnostic of hepatocellular carcinoma. In not already obtained, multidisciplinary (surgical, oncological and interventional radiology) consultation is recommended. References: Bruix Maury Dus; American Association for the Study of Liver Diseases. Management of hepatocellular carcinoma: an update. Hepatology 2011;53:1020-1022, and [place] for Liver Transplant Allocation: Standardization of Liver Imaging, Diagnosis, Classification, and Reporting of  Hepatocellular Carcinoma1 Radiology: Volume 266: Number 2-February 2013 2. There are additional liver lesions which appear to reflect cysts and hemangiomas. 3. Heterogeneous enhancing 2.7 cm mass in the upper pole of the right kidney worrisome for renal cell carcinoma. 4. These results will be called to the ordering clinician or representative by the Radiologist Assistant, and communication documented in the PACS or zVision Dashboard. Electronically Signed   By: Richardean Sale M.D.   On: 05/15/2015 10:19   US Abdomen Limited  05/13/2015  CLINICAL DATA:  Hepatic cirrhosis. EXAM: US ABDOMEN LIMITED - RIGHT UPPER QUADRANT COMPARISON:  Ultrasound of November 20, 2014. FINDINGS: Gallbladder: No gallstones or wall thickening visualized. No sonographic Murphy sign noted by sonographer. Common bile duct: Diameter: 3.3 mm which is within normal limits. Liver: Heterogeneous echotexture is noted consistent with history of hepatic cirrhosis. Lobulated echogenic mass measuring 5.3 x 4.0 x 3.4 cm is noted in right hepatic lobe peripherally which demonstrates blood flow on Doppler. Given  the history of hepatic cirrhosis, this is concerning for hepatocellular carcinoma. IMPRESSION: Heterogeneous echotexture of hepatic parenchyma is noted consistent with history of hepatic cirrhosis. Interval development of 5.3 cm echogenic mass in right hepatic lobe with positive blood flow on Doppler concerning for hepatocellular carcinoma. MRI of the liver with and without gadolinium is recommended for further evaluation. These results will be called to the ordering clinician or representative by the Radiologist Assistant, and communication documented in the PACS or zVision Dashboard. Electronically Signed   By: Marijo Conception, M.D.   On: 05/13/2015 10:56    ASSESSMENT & PLAN:  60 year old Caucasian male, with past medical history of alcohol and drug abuse, quit long time ago, was recently found hepatitis C and compensated liver  cirrhosis. Routine surveillance ultrasound showed a new liver mass in April 2017.  1. Hepatocellular carcinoma, stage I -I reviewed his liver MRI scan images in person with patient and his wife. The dominant right colon lesion has typical arterial enhancement and venous washout, in the setting of liver cirrhosis and hepatitis C, this is probably diagnostic for hepatocellular carcinoma. I do not think tissue biopsy is necessary. -His AFP was normal -I will get a noncontrast CT chest to rule out distant metastasis. -I will reviewed the imaging findings in our multidisciplinary GI tumor board next Wednesday. -We discussed the treatment options for early stage liver cancer, including surgical resection, liver transplant, tumor embolization, and the palliative systemic therapy. Given his good liver function, and general health, no significant blood vessel invasion, I think this is probably resectable. I'll refer him to surgeon Dr. Barry Dienes to consider surgical resection. -We also discussed the role of liver transplant (need embolization first to downstage the tumor), and liver targeted therapy such as ablation and embolization, which can be the backup options if surgical resection is not feasible. -We also discussed the surveillance after surgical resection, including routine follow-up, with lab and AFP, and a routine liver MRI every 4-8 month.   2. HCV  -was recently treated and eradicated -He'll continue follow-up with Dr. Linus Allen  3. Right renal lesion -his MRI showed a 2.7cm right kidney mass, which is concerning for renal cell carcinoma -We discussed at that renal cell carcinoma, most indolent, and we probably can watch it for now, repeat scan in 3 months. -I'll refer him to urology after his liver surgery  4. History of multiple substance abuse -He has quit drugs, alcohol and smoking.  5. HTN -Follow up with primary care physician  Recommendations: Based on information available as of today's  consult. Recommendations may change depending on the results of further tests or exams. 1) 1st choice-referral to Dr. Grafton Folk is #1 treatment if tumor is resectable 2) 2nd choice-liver transplant (would need embolization to reduce size first to < 5cm) 3) 3rd-Ablation-tumor is too large for this 4) Chemotherapy not indicated at this time-reserved for advanced disease ______________________________________________________________________________ Next Steps: 1) Referral to surgery 2) CT chest wo contrast  3) Case discussion in GI Tumor Board on 5/3 and we will call you afterwards 4) Follow up with me after surgery and alternate visits w/Dr. Linus Allen 5) Continue MRI every 6 months (3 months after embolization procedure)  All questions were answered. The patient knows to call the clinic with any problems, questions or concerns. I spent 55 minutes counseling the patient face to face. The total time spent in the appointment was 60 minutes and more than 50% was on counseling.     Joe Merle, MD  05/21/2015 3:18 PM

## 2015-05-21 NOTE — Addendum Note (Signed)
Addended by: Truitt Merle on: 05/21/2015 04:58 PM   Modules accepted: Orders

## 2015-05-22 ENCOUNTER — Telehealth: Payer: Self-pay | Admitting: *Deleted

## 2015-05-22 ENCOUNTER — Telehealth: Payer: Self-pay | Admitting: Hematology

## 2015-05-22 NOTE — Telephone Encounter (Signed)
  Oncology Nurse Navigator Documentation  Navigator Location: [facility] (05/22/15 1018) Navigator Encounter Type: Telephone (05/22/15 1018) Telephone: Outgoing Call;Patient Update (05/22/15 1018)  Left VM that Dr. Burr Medico wishes to have CT chest done to complete the staging workup for him. Doubts there will be anything wrong, but want to cover all bases. Radiology will be calling with the appointment.

## 2015-05-22 NOTE — Telephone Encounter (Signed)
per pof tosch pt referral to Dr Vaughan Sine & left message w/Stephanie- Barry Dienes scheduler and adv of pt demo and MRN adv to call pt w/appt

## 2015-05-25 ENCOUNTER — Other Ambulatory Visit: Payer: Self-pay | Admitting: General Surgery

## 2015-06-03 ENCOUNTER — Ambulatory Visit (HOSPITAL_COMMUNITY)
Admission: RE | Admit: 2015-06-03 | Discharge: 2015-06-03 | Disposition: A | Discharge status: Home / Self Care | Payer: Commercial Managed Care - PPO | Source: Ambulatory Visit | Attending: Hematology | Admitting: Hematology

## 2015-06-03 DIAGNOSIS — F17210 Nicotine dependence, cigarettes, uncomplicated: Secondary | ICD-10-CM | POA: Diagnosis not present

## 2015-06-03 DIAGNOSIS — R918 Other nonspecific abnormal finding of lung field: Secondary | ICD-10-CM | POA: Diagnosis not present

## 2015-06-03 DIAGNOSIS — I2510 Atherosclerotic heart disease of native coronary artery without angina pectoris: Secondary | ICD-10-CM | POA: Diagnosis not present

## 2015-06-03 DIAGNOSIS — N2889 Other specified disorders of kidney and ureter: Secondary | ICD-10-CM | POA: Insufficient documentation

## 2015-06-03 DIAGNOSIS — C220 Liver cell carcinoma: Secondary | ICD-10-CM | POA: Diagnosis not present

## 2015-06-09 ENCOUNTER — Other Ambulatory Visit (HOSPITAL_COMMUNITY): Payer: Self-pay | Admitting: *Deleted

## 2015-06-09 NOTE — Pre-Procedure Instructions (Signed)
Joe Allen  06/09/2015      [facility] [place], [place]. [place]. [place] [address] Phone: [phone] Fax: [phone]  [facility] [address], [place] - [id number] [place] [place] [address] Phone: [phone] Fax: [phone]  [facility] [place] [place], [place]. [address] [place] [address] Phone: [phone] Fax: [place], [place] [address] Phone: [phone] Fax: [phone]    Your procedure is scheduled on Tuesday, Jun 16, 2015 at 11:00 AM.  Report to [facility] Entrance "A" Admitting Office at 9:00 AM.  Call this number if you have problems the morning of surgery: [phone]  Any questions prior to day of surgery, please call [phone] between 8 & 4 PM.   Remember:  Do not eat food or drink liquids after midnight Monday, 06/15/15.  Take these medicines the morning of surgery with A SIP OF WATER: Omeprazole (Prilosec)   Do not wear jewelry.  Do not wear lotions, powders, or cologne.  You may wear deodorant.  Men may shave face and neck.  Do not bring valuables to the hospital.  [facility] is not responsible for any belongings or valuables.  Contacts, dentures or bridgework may not be worn into surgery.  Leave your suitcase in the car.  After surgery it may be brought to your room.  For patients admitted to the hospital, discharge time will be determined by your treatment team.  Special instructions:  [facility] - Preparing for Surgery  Before surgery, you can play an important role.  Because skin is not sterile, your skin needs to be as free of germs as possible.  You can reduce the number of germs on you skin by washing with CHG (chlorahexidine gluconate) soap before surgery.  CHG is an antiseptic cleaner  which kills germs and bonds with the skin to continue killing germs even after washing.  Please DO NOT use if you have an allergy to CHG or antibacterial soaps.  If your skin becomes reddened/irritated stop using the CHG and inform your nurse when you arrive at Short Stay.  Do not shave (including legs and underarms) for at least 48 hours prior to the first CHG shower.  You may shave your face.  Please follow these instructions carefully:   1.  Shower with CHG Soap the night before surgery and the                                morning of Surgery.  2.  If you choose to wash your hair, wash your hair first as usual with your       normal shampoo.  3.  After you shampoo, rinse your hair and body thoroughly to remove the                      Shampoo.  4.  Use CHG as you would any other liquid soap.  You can apply chg directly       to the skin and wash gently with scrungie or a clean washcloth.  5.  Apply the CHG Soap to your body ONLY FROM THE NECK DOWN.        Do not use on open wounds or open sores.  Avoid contact  with your eyes, ears, mouth and genitals (private parts).  Wash genitals (private parts) with your normal soap.  6.  Wash thoroughly, paying special attention to the area where your surgery        will be performed.  7.  Thoroughly rinse your body with warm water from the neck down.  8.  DO NOT shower/wash with your normal soap after using and rinsing off       the CHG Soap.  9.  Pat yourself dry with a clean towel.            10.  Wear clean pajamas.            11.  Place clean sheets on your bed the night of your first shower and do not        sleep with pets.  Day of Surgery  Do not apply any lotions the morning of surgery.  Please wear clean clothes to the hospital.  Please read over the following fact sheets that you were given. Pain Booklet, Coughing and Deep Breathing and Surgical Site Infection Prevention

## 2015-06-10 ENCOUNTER — Encounter (HOSPITAL_COMMUNITY)
Admission: RE | Admit: 2015-06-10 | Discharge: 2015-06-10 | Disposition: A | Discharge status: Home / Self Care | Payer: Commercial Managed Care - PPO | Source: Ambulatory Visit | Attending: General Surgery | Admitting: General Surgery

## 2015-06-10 ENCOUNTER — Encounter (HOSPITAL_COMMUNITY): Payer: Self-pay

## 2015-06-10 DIAGNOSIS — Z0183 Encounter for blood typing: Secondary | ICD-10-CM | POA: Insufficient documentation

## 2015-06-10 DIAGNOSIS — Z01818 Encounter for other preprocedural examination: Secondary | ICD-10-CM | POA: Insufficient documentation

## 2015-06-10 DIAGNOSIS — I10 Essential (primary) hypertension: Secondary | ICD-10-CM | POA: Insufficient documentation

## 2015-06-10 DIAGNOSIS — Z01812 Encounter for preprocedural laboratory examination: Secondary | ICD-10-CM | POA: Diagnosis not present

## 2015-06-10 HISTORY — DX: Malignant (primary) neoplasm, unspecified: C80.1

## 2015-06-10 HISTORY — DX: Gastro-esophageal reflux disease without esophagitis: K21.9

## 2015-06-10 LAB — COMPREHENSIVE METABOLIC PANEL
ALT: 26 U/L (ref 17–63)
ANION GAP: 11 (ref 5–15)
AST: 25 U/L (ref 15–41)
Albumin: 4.1 g/dL (ref 3.5–5.0)
Alkaline Phosphatase: 49 U/L (ref 38–126)
BUN: 9 mg/dL (ref 6–20)
CALCIUM: 9.6 mg/dL (ref 8.9–10.3)
CO2: 24 mmol/L (ref 22–32)
Chloride: 103 mmol/L (ref 101–111)
Creatinine, Ser: 0.83 mg/dL (ref 0.61–1.24)
Glucose, Bld: 116 mg/dL — ABNORMAL HIGH (ref 65–99)
Potassium: 4.2 mmol/L (ref 3.5–5.1)
SODIUM: 138 mmol/L (ref 135–145)
TOTAL PROTEIN: 7.7 g/dL (ref 6.5–8.1)
Total Bilirubin: 0.9 mg/dL (ref 0.3–1.2)

## 2015-06-10 LAB — CBC WITH DIFFERENTIAL/PLATELET
Basophils Absolute: 0 10*3/uL (ref 0.0–0.1)
Basophils Relative: 0 %
EOS ABS: 0.1 10*3/uL (ref 0.0–0.7)
EOS PCT: 2 %
HCT: 41.3 % (ref 39.0–52.0)
Hemoglobin: 14.5 g/dL (ref 13.0–17.0)
LYMPHS ABS: 1.3 10*3/uL (ref 0.7–4.0)
Lymphocytes Relative: 22 %
MCH: 29.8 pg (ref 26.0–34.0)
MCHC: 35.1 g/dL (ref 30.0–36.0)
MCV: 84.8 fL (ref 78.0–100.0)
MONO ABS: 0.4 10*3/uL (ref 0.1–1.0)
MONOS PCT: 7 %
Neutro Abs: 4.3 10*3/uL (ref 1.7–7.7)
Neutrophils Relative %: 69 %
PLATELETS: 157 10*3/uL (ref 150–400)
RBC: 4.87 MIL/uL (ref 4.22–5.81)
RDW: 13.4 % (ref 11.5–15.5)
WBC: 6.1 10*3/uL (ref 4.0–10.5)

## 2015-06-10 LAB — PREPARE RBC (CROSSMATCH)

## 2015-06-10 LAB — ABO/RH: ABO/RH(D): O POS

## 2015-06-10 LAB — PROTIME-INR
INR: 1.04 (ref 0.00–1.49)
PROTHROMBIN TIME: 13.8 s (ref 11.6–15.2)

## 2015-06-10 NOTE — Progress Notes (Signed)
Pt denies any cardiac testing or cardiologist.   Primary care office is [facility]  Pt has not been instructed on bowel prep ("One day bowel prep ordered').  Office called and pt is going to stop by the office after this visit and speak with [facility] regarding this.

## 2015-06-10 NOTE — Pre-Procedure Instructions (Signed)
Joe Allen  06/10/2015      [facility] [place], [place]. [place]. [place] [address] Phone: [phone] Fax: [phone]  [facility] [address], [place] - [id number] [place] [place] [address] Phone: [phone] Fax: [phone]  [place], [place]. [address] [place] [address] Phone: [phone] Fax: [place], [place] [address] Phone: [phone] Fax: [phone]    Your procedure is scheduled on Tuesday, Jun 16, 2015 at 11:00 AM.  Report to [facility] Entrance "A" Admitting Office at 9:00 AM.  Call this number if you have problems the morning of surgery: [phone]  Any questions prior to day of surgery, please call [phone] between 8 & 4 PM.   Remember:  Do not eat food or drink liquids after midnight Monday, 06/15/15.  Take these medicines the morning of surgery with A SIP OF WATER: Omeprazole (Prilosec)   Do not wear jewelry.  Do not wear lotions, powders, or cologne.  You may wear deodorant.  Men may shave face and neck.  Do not bring valuables to the hospital.  [facility] is not responsible for any belongings or valuables.  Contacts, dentures or bridgework may not be worn into surgery.  Leave your suitcase in the car.  After surgery it may be brought to your room.  For patients admitted to the hospital, discharge time will be determined by your treatment team.  Special instructions:  [place] - Preparing for Surgery  Before surgery, you can play an important role.  Because skin is not sterile, your skin needs to be as free of germs as possible.  You can reduce the number of germs on you skin by washing with CHG (chlorahexidine gluconate) soap before surgery.  CHG is an antiseptic cleaner  which kills germs and bonds with the skin to continue killing germs even after washing.  Please DO NOT use if you have an allergy to CHG or antibacterial soaps.  If your skin becomes reddened/irritated stop using the CHG and inform your nurse when you arrive at Short Stay.  Do not shave (including legs and underarms) for at least 48 hours prior to the first CHG shower.  You may shave your face.  Please follow these instructions carefully:   1.  Shower with CHG Soap the night before surgery and the                                morning of Surgery.  2.  If you choose to wash your hair, wash your hair first as usual with your       normal shampoo.  3.  After you shampoo, rinse your hair and body thoroughly to remove the                      Shampoo.  4.  Use CHG as you would any other liquid soap.  You can apply chg directly       to the skin and wash gently with scrungie or a clean washcloth.  5.  Apply the CHG Soap to your body ONLY FROM THE NECK DOWN.        Do not use on open wounds or open sores.  Avoid contact  with your eyes, ears, mouth and genitals (private parts).  Wash genitals (private parts) with your normal soap.  6.  Wash thoroughly, paying special attention to the area where your surgery        will be performed.  7.  Thoroughly rinse your body with warm water from the neck down.  8.  DO NOT shower/wash with your normal soap after using and rinsing off       the CHG Soap.  9.  Pat yourself dry with a clean towel.            10.  Wear clean pajamas.            11.  Place clean sheets on your bed the night of your first shower and do not        sleep with pets.  Day of Surgery  Do not apply any lotions the morning of surgery.  Please wear clean clothes to the hospital.  Please read over the following fact sheets that you were given. Pain Booklet, Coughing and Deep Breathing and Surgical Site Infection Prevention

## 2015-06-12 ENCOUNTER — Other Ambulatory Visit: Payer: Self-pay | Admitting: Urology

## 2015-06-15 MED ORDER — CEFAZOLIN SODIUM-DEXTROSE 2-4 GM/100ML-% IV SOLN
2.0000 g | INTRAVENOUS | Status: AC
  Administered 2015-06-16 (×2): 2 g via INTRAVENOUS
  Filled 2015-06-15: qty 100

## 2015-06-15 NOTE — H&P (Signed)
Reason For Visit Right renal mass   History of Present Illness 60 year old male seen today for further evaluation of a right enhancing renal mass which was seen incidentally on a MRI. The MRI was performed in surveillance of his hepatitis and the patient was noted to have a 5 cm liver lesion. He is scheduled to undergo a partial hepatectomy next week, and would like to have his renal mass resected at the same time. The patient is otherwise quite healthy and does not feel or act sick. He denies any flank pain or hematuria. He has no family history of GU malignancies. He easily can walk a flight of steps and is functionally in great shape. Patient's past medical history is significant for hepatitis C, hypertension and acid reflux. He has no past surgical history.   Past Medical History Problems  1. H/O liver cancer (Z85.05) 2. History of heartburn (Z87.898) 3. History of hepatic disease (Z87.19) 4. History of hepatitis (Z86.19) 5. History of hypertension (Z86.79)  Surgical History Problems  1. History of Skull Repair  Current Meds 1. Lisinopril 20 MG Oral Tablet;  Therapy: (Recorded:19May2017) to Recorded 2. PriLOSEC OTC 20 MG Oral Tablet Delayed Release;  Therapy: (Recorded:19May2017) to Recorded  Allergies Medication  1. Codeine Derivatives  Family History Problems  1. No pertinent family history : Mother  Social History Problems    Denied: History of Alcohol use   Caffeine use (F15.90)   Former smoker [phone])   1 ppd for 28 years and quit it 2010   Married   Number of children   2 daughters   Occupation   Maintenance  Review of Systems Genitourinary, constitutional, skin, eye, otolaryngeal, hematologic/lymphatic, cardiovascular, pulmonary, endocrine, musculoskeletal, gastrointestinal, neurological and psychiatric system(s) were reviewed and pertinent findings if present are noted and are otherwise negative.  Genitourinary: nocturia.  Gastrointestinal:  heartburn.  Musculoskeletal: back pain.    Vitals Vital Signs [Data Includes: Last 1 Day]  Recorded: [id number] 01:16PM  Height: 5 ft 10 in Weight: 218 lb  BMI Calculated: 31.28 BSA Calculated: 2.17 Blood Pressure: 113 / 83 Temperature: 97.9 F Heart Rate: 80  Physical Exam Constitutional: Well nourished and well developed . No acute distress.  ENT:. The ears and nose are normal in appearance.  Pulmonary: No respiratory distress and normal respiratory rhythm and effort.  Cardiovascular: Heart rate and rhythm are normal . No peripheral edema.  Abdomen: The abdomen is soft and nontender. No masses are palpated. No CVA tenderness. No hernias are palpable.  Lymphatics: The femoral and inguinal nodes are not enlarged or tender.  Skin: Normal skin turgor, no visible rash and no visible skin lesions.  Neuro/Psych:. Mood and affect are appropriate.    Results/Data Urine [Data Includes: Last 1 Day]   [id number]  COLOR AMBER   APPEARANCE CLEAR   SPECIFIC GRAVITY 1.015   pH 6.0   GLUCOSE NEGATIVE   BILIRUBIN NEGATIVE   KETONE NEGATIVE   BLOOD NEGATIVE   PROTEIN TRACE   NITRITE NEGATIVE   LEUKOCYTE ESTERASE NEGATIVE    The patient's urinalysis today is normal.  I've independently reviewed the patient's MRI of the abdomen/pelvis as well as the patient's CT scans of the chest. The patient has a 2.5 cm lesion in the posterior aspect of the right upper pole. The renal hilum has one artery and one vein. The renal artery has an early branch. The tumor itself is largely endophytic.   Assessment Assessed  1. 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 UA With REFLEX; [Do Not Release]; Status:Complete;   DoneYF:[id number] 12:43PM  Discussion/Summary The patient has a right upper pole enhancing renal mass concerning for renal cell carcinoma. This was an incidental finding as part of the patient's workup for his hepatitis C and subsequent diagnosis of a large liver lesion. He  is scheduled to undergo an open partial hepatectomy and as part of the operation we have encouraged the patient to consider a right partial nephrectomy as well. I talked to the patient about this extensively. The patient will already be under anesthesia with likely really good access to the kidney requiring minimal dissection. This should not add a tremendous amount of time to the operation. We did discuss the risks of this aspect of the operation which would include bleeding and urine leak. In addition, I explained to the patient's small risk of requiring a nephrectomy. There is also the risk of damage to the surrounding structures. Having gone over the surgery for the patient and his significant other, the patient is eager to include this as part of his upcoming surgery. I'll plan to be available for the second part of the surgery which is on May 23rd at [facility].

## 2015-06-16 ENCOUNTER — Inpatient Hospital Stay (HOSPITAL_COMMUNITY)
Admission: RE | Admit: 2015-06-16 | Discharge: 2015-07-25 | DRG: 656 | Disposition: E | Payer: Commercial Managed Care - PPO | Source: Ambulatory Visit | Attending: General Surgery | Admitting: General Surgery

## 2015-06-16 ENCOUNTER — Encounter (HOSPITAL_COMMUNITY): Admission: RE | Disposition: E | Payer: Self-pay | Source: Ambulatory Visit | Attending: General Surgery

## 2015-06-16 ENCOUNTER — Inpatient Hospital Stay (HOSPITAL_COMMUNITY): Payer: Commercial Managed Care - PPO | Admitting: Anesthesiology

## 2015-06-16 DIAGNOSIS — A419 Sepsis, unspecified organism: Secondary | ICD-10-CM | POA: Diagnosis not present

## 2015-06-16 DIAGNOSIS — Z6831 Body mass index (BMI) 31.0-31.9, adult: Secondary | ICD-10-CM | POA: Diagnosis not present

## 2015-06-16 DIAGNOSIS — R571 Hypovolemic shock: Secondary | ICD-10-CM | POA: Diagnosis not present

## 2015-06-16 DIAGNOSIS — D1803 Hemangioma of intra-abdominal structures: Secondary | ICD-10-CM | POA: Diagnosis present

## 2015-06-16 DIAGNOSIS — D689 Coagulation defect, unspecified: Secondary | ICD-10-CM | POA: Diagnosis not present

## 2015-06-16 DIAGNOSIS — R0602 Shortness of breath: Secondary | ICD-10-CM

## 2015-06-16 DIAGNOSIS — I10 Essential (primary) hypertension: Secondary | ICD-10-CM | POA: Diagnosis present

## 2015-06-16 DIAGNOSIS — B182 Chronic viral hepatitis C: Secondary | ICD-10-CM | POA: Diagnosis present

## 2015-06-16 DIAGNOSIS — Z4659 Encounter for fitting and adjustment of other gastrointestinal appliance and device: Secondary | ICD-10-CM

## 2015-06-16 DIAGNOSIS — C220 Liver cell carcinoma: Secondary | ICD-10-CM | POA: Diagnosis present

## 2015-06-16 DIAGNOSIS — K7460 Unspecified cirrhosis of liver: Secondary | ICD-10-CM | POA: Diagnosis present

## 2015-06-16 DIAGNOSIS — E875 Hyperkalemia: Secondary | ICD-10-CM | POA: Diagnosis present

## 2015-06-16 DIAGNOSIS — I4901 Ventricular fibrillation: Secondary | ICD-10-CM | POA: Diagnosis not present

## 2015-06-16 DIAGNOSIS — G9341 Metabolic encephalopathy: Secondary | ICD-10-CM | POA: Diagnosis not present

## 2015-06-16 DIAGNOSIS — K219 Gastro-esophageal reflux disease without esophagitis: Secondary | ICD-10-CM | POA: Diagnosis present

## 2015-06-16 DIAGNOSIS — R4182 Altered mental status, unspecified: Secondary | ICD-10-CM

## 2015-06-16 DIAGNOSIS — Z87891 Personal history of nicotine dependence: Secondary | ICD-10-CM

## 2015-06-16 DIAGNOSIS — C641 Malignant neoplasm of right kidney, except renal pelvis: Secondary | ICD-10-CM | POA: Diagnosis present

## 2015-06-16 DIAGNOSIS — R739 Hyperglycemia, unspecified: Secondary | ICD-10-CM | POA: Diagnosis present

## 2015-06-16 DIAGNOSIS — R109 Unspecified abdominal pain: Secondary | ICD-10-CM

## 2015-06-16 DIAGNOSIS — E872 Acidosis: Secondary | ICD-10-CM | POA: Diagnosis not present

## 2015-06-16 DIAGNOSIS — Z8249 Family history of ischemic heart disease and other diseases of the circulatory system: Secondary | ICD-10-CM

## 2015-06-16 DIAGNOSIS — R319 Hematuria, unspecified: Secondary | ICD-10-CM | POA: Diagnosis present

## 2015-06-16 DIAGNOSIS — D6959 Other secondary thrombocytopenia: Secondary | ICD-10-CM | POA: Diagnosis present

## 2015-06-16 DIAGNOSIS — K661 Hemoperitoneum: Secondary | ICD-10-CM | POA: Diagnosis not present

## 2015-06-16 DIAGNOSIS — E861 Hypovolemia: Secondary | ICD-10-CM | POA: Diagnosis present

## 2015-06-16 DIAGNOSIS — J9690 Respiratory failure, unspecified, unspecified whether with hypoxia or hypercapnia: Secondary | ICD-10-CM

## 2015-06-16 DIAGNOSIS — I213 ST elevation (STEMI) myocardial infarction of unspecified site: Secondary | ICD-10-CM | POA: Diagnosis not present

## 2015-06-16 DIAGNOSIS — D62 Acute posthemorrhagic anemia: Secondary | ICD-10-CM | POA: Diagnosis not present

## 2015-06-16 DIAGNOSIS — R0902 Hypoxemia: Secondary | ICD-10-CM

## 2015-06-16 DIAGNOSIS — J9601 Acute respiratory failure with hypoxia: Secondary | ICD-10-CM | POA: Diagnosis not present

## 2015-06-16 DIAGNOSIS — E15 Nondiabetic hypoglycemic coma: Secondary | ICD-10-CM | POA: Diagnosis not present

## 2015-06-16 DIAGNOSIS — K7290 Hepatic failure, unspecified without coma: Secondary | ICD-10-CM | POA: Diagnosis present

## 2015-06-16 HISTORY — PX: LAPAROSCOPIC HEPATECTOMY: SHX5897

## 2015-06-16 HISTORY — PX: NEPHRECTOMY: SHX65

## 2015-06-16 LAB — POCT I-STAT 7, (LYTES, BLD GAS, ICA,H+H)
ACID-BASE DEFICIT: 2 mmol/L (ref 0.0–2.0)
ACID-BASE DEFICIT: 2 mmol/L (ref 0.0–2.0)
Acid-base deficit: 3 mmol/L — ABNORMAL HIGH (ref 0.0–2.0)
Bicarbonate: 22.6 mEq/L (ref 20.0–24.0)
Bicarbonate: 24.2 mEq/L — ABNORMAL HIGH (ref 20.0–24.0)
Bicarbonate: 23.2 mEq/L (ref 20.0–24.0)
CALCIUM ION: 1.21 mmol/L (ref 1.12–1.23)
Calcium, Ion: 1.11 mmol/L — ABNORMAL LOW (ref 1.12–1.23)
Calcium, Ion: 1.27 mmol/L — ABNORMAL HIGH (ref 1.12–1.23)
HCT: 34 % — ABNORMAL LOW (ref 39.0–52.0)
HCT: 33 % — ABNORMAL LOW (ref 39.0–52.0)
HEMATOCRIT: 37 % — AB (ref 39.0–52.0)
HEMOGLOBIN: 12.6 g/dL — AB (ref 13.0–17.0)
HEMOGLOBIN: 11.2 g/dL — AB (ref 13.0–17.0)
Hemoglobin: 11.6 g/dL — ABNORMAL LOW (ref 13.0–17.0)
O2 SAT: 100 %
O2 SAT: 100 %
O2 Saturation: 100 %
PCO2 ART: 41 mmHg (ref 35.0–45.0)
PCO2 ART: 43.5 mmHg (ref 35.0–45.0)
PH ART: 7.372 (ref 7.350–7.450)
PO2 ART: 315 mmHg — AB (ref 80.0–100.0)
PO2 ART: 396 mmHg — AB (ref 80.0–100.0)
PO2 ART: 517 mmHg — AB (ref 80.0–100.0)
POTASSIUM: 4.5 mmol/L (ref 3.5–5.1)
Patient temperature: 35.8
Patient temperature: 36
Potassium: 4 mmol/L (ref 3.5–5.1)
Potassium: 4.6 mmol/L (ref 3.5–5.1)
Sodium: 141 mmol/L (ref 135–145)
Sodium: 139 mmol/L (ref 135–145)
Sodium: 138 mmol/L (ref 135–145)
TCO2: 24 mmol/L (ref 0–100)
TCO2: 26 mmol/L (ref 0–100)
TCO2: 24 mmol/L (ref 0–100)
pCO2 arterial: 39.5 mmHg (ref 35.0–45.0)
pH, Arterial: 7.344 — ABNORMAL LOW (ref 7.350–7.450)
pH, Arterial: 7.349 — ABNORMAL LOW (ref 7.350–7.450)

## 2015-06-16 LAB — GLUCOSE, CAPILLARY
GLUCOSE-CAPILLARY: 120 mg/dL — AB (ref 65–99)
GLUCOSE-CAPILLARY: 246 mg/dL — AB (ref 65–99)

## 2015-06-16 LAB — CBC
HCT: 41 % (ref 39.0–52.0)
Hemoglobin: 13.7 g/dL (ref 13.0–17.0)
MCH: 28.8 pg (ref 26.0–34.0)
MCHC: 33.4 g/dL (ref 30.0–36.0)
MCV: 86.1 fL (ref 78.0–100.0)
PLATELETS: 176 10*3/uL (ref 150–400)
RBC: 4.76 MIL/uL (ref 4.22–5.81)
RDW: 13.8 % (ref 11.5–15.5)
WBC: 27.4 10*3/uL — AB (ref 4.0–10.5)

## 2015-06-16 LAB — BASIC METABOLIC PANEL
Anion gap: 5 (ref 5–15)
BUN: 11 mg/dL (ref 6–20)
CALCIUM: 8.6 mg/dL — AB (ref 8.9–10.3)
CHLORIDE: 110 mmol/L (ref 101–111)
CO2: 20 mmol/L — AB (ref 22–32)
CREATININE: 1.25 mg/dL — AB (ref 0.61–1.24)
GFR calc non Af Amer: >60 mL/min (ref >60)
Glucose, Bld: 218 mg/dL — ABNORMAL HIGH (ref 65–99)
Potassium: 6.4 mmol/L (ref 3.5–5.1)
SODIUM: 135 mmol/L (ref 135–145)

## 2015-06-16 LAB — POTASSIUM: POTASSIUM: 6 mmol/L — AB (ref 3.5–5.1)

## 2015-06-16 LAB — POCT I-STAT 3, ART BLOOD GAS (G3+)
ACID-BASE DEFICIT: 9 mmol/L — AB (ref 0.0–2.0)
Bicarbonate: 18.6 mEq/L — ABNORMAL LOW (ref 20.0–24.0)
O2 SAT: 100 %
PCO2 ART: 45.2 mmHg — AB (ref 35.0–45.0)
PH ART: 7.22 — AB (ref 7.350–7.450)
TCO2: 20 mmol/L (ref 0–100)
pO2, Arterial: 244 mmHg — ABNORMAL HIGH (ref 80.0–100.0)

## 2015-06-16 LAB — PROTIME-INR
INR: 1.53 — ABNORMAL HIGH (ref 0.00–1.49)
PROTHROMBIN TIME: 18.5 s — AB (ref 11.6–15.2)

## 2015-06-16 LAB — PREPARE RBC (CROSSMATCH)

## 2015-06-16 SURGERY — HEPATECTOMY, LAPAROSCOPIC
Anesthesia: General | Laterality: Right

## 2015-06-16 MED ORDER — DEXTROSE 50 % IV SOLN
1.0000 | Freq: Once | INTRAVENOUS | Status: AC
  Administered 2015-06-16: 50 mL via INTRAVENOUS

## 2015-06-16 MED ORDER — PROPOFOL 1000 MG/100ML IV EMUL
5.0000 ug/kg/min | INTRAVENOUS | Status: DC
  Administered 2015-06-16: 40 ug/kg/min via INTRAVENOUS
  Administered 2015-06-16: 50 ug/kg/min via INTRAVENOUS
  Administered 2015-06-17: 20 ug/kg/min via INTRAVENOUS
  Filled 2015-06-16 (×2): qty 100

## 2015-06-16 MED ORDER — PHENYLEPHRINE HCL 10 MG/ML IJ SOLN
INTRAMUSCULAR | Status: DC | PRN
  Administered 2015-06-16 (×3): 80 ug via INTRAVENOUS

## 2015-06-16 MED ORDER — DIPHENHYDRAMINE HCL 50 MG/ML IJ SOLN: 12.5000 mg | Freq: Four times a day (QID) | INTRAMUSCULAR | Status: DC | PRN

## 2015-06-16 MED ORDER — LIDOCAINE HCL 1 % IJ SOLN
INTRAMUSCULAR | Status: DC | PRN
  Administered 2015-06-16: 11:00:00 via INTRAMUSCULAR

## 2015-06-16 MED ORDER — MIDAZOLAM HCL 2 MG/2ML IJ SOLN
INTRAMUSCULAR | Status: AC
  Filled 2015-06-16: qty 2

## 2015-06-16 MED ORDER — LIDOCAINE HCL (PF) 1 % IJ SOLN
INTRAMUSCULAR | Status: AC
  Filled 2015-06-16: qty 30

## 2015-06-16 MED ORDER — DEXMEDETOMIDINE HCL IN NACL 400 MCG/100ML IV SOLN
INTRAVENOUS | Status: DC | PRN
  Administered 2015-06-16: .4 ug/kg/h via INTRAVENOUS

## 2015-06-16 MED ORDER — CEFAZOLIN SODIUM-DEXTROSE 2-4 GM/100ML-% IV SOLN
2.0000 g | Freq: Three times a day (TID) | INTRAVENOUS | Status: AC
  Administered 2015-06-16: 2 g via INTRAVENOUS
  Filled 2015-06-16: qty 100

## 2015-06-16 MED ORDER — HYDRALAZINE HCL 20 MG/ML IJ SOLN
10.0000 mg | INTRAMUSCULAR | Status: DC | PRN
  Administered 2015-06-21: 10 mg via INTRAVENOUS
  Filled 2015-06-16: qty 1

## 2015-06-16 MED ORDER — VECURONIUM BROMIDE 10 MG IV SOLR: INTRAVENOUS | Status: DC | PRN

## 2015-06-16 MED ORDER — PROPOFOL 10 MG/ML IV BOLUS
INTRAVENOUS | Status: DC | PRN
  Administered 2015-06-16: 200 mg via INTRAVENOUS

## 2015-06-16 MED ORDER — BISACODYL 5 MG PO TBEC: 5.0000 mg | DELAYED_RELEASE_TABLET | Freq: Every day | ORAL | Status: DC | PRN

## 2015-06-16 MED ORDER — HEMOSTATIC AGENTS (NO CHARGE) OPTIME
TOPICAL | Status: DC | PRN
  Administered 2015-06-16 (×2): 1 via TOPICAL

## 2015-06-16 MED ORDER — ROCURONIUM BROMIDE 100 MG/10ML IV SOLN
INTRAVENOUS | Status: DC | PRN
  Administered 2015-06-16: 20 mg via INTRAVENOUS
  Administered 2015-06-16: 50 mg via INTRAVENOUS
  Administered 2015-06-16: 10 mg via INTRAVENOUS
  Administered 2015-06-16: 20 mg via INTRAVENOUS

## 2015-06-16 MED ORDER — VASOPRESSIN 20 UNIT/ML IV SOLN
INTRAVENOUS | Status: AC
  Filled 2015-06-16: qty 1

## 2015-06-16 MED ORDER — DIPHENHYDRAMINE HCL 12.5 MG/5ML PO ELIX: 12.5000 mg | ORAL_SOLUTION | Freq: Four times a day (QID) | ORAL | Status: DC | PRN

## 2015-06-16 MED ORDER — SENNA 8.6 MG PO TABS
1.0000 | ORAL_TABLET | Freq: Two times a day (BID) | ORAL | Status: DC
  Administered 2015-06-18 – 2015-06-24 (×9): 8.6 mg via ORAL
  Filled 2015-06-16 (×12): qty 1

## 2015-06-16 MED ORDER — VECURONIUM BROMIDE 10 MG IV SOLR
INTRAVENOUS | Status: DC | PRN
  Administered 2015-06-16 (×3): 3 mg via INTRAVENOUS
  Administered 2015-06-16: 5 mg via INTRAVENOUS

## 2015-06-16 MED ORDER — SODIUM CHLORIDE 0.9 % IV SOLN
1.0000 g | Freq: Once | INTRAVENOUS | Status: AC
  Administered 2015-06-16: 1 g via INTRAVENOUS
  Filled 2015-06-16: qty 10

## 2015-06-16 MED ORDER — METHOCARBAMOL 500 MG PO TABS
500.0000 mg | ORAL_TABLET | Freq: Four times a day (QID) | ORAL | Status: DC | PRN
  Filled 2015-06-16: qty 1

## 2015-06-16 MED ORDER — BUPIVACAINE 0.25 % ON-Q PUMP DUAL CATH 300 ML
300.0000 mL | INJECTION | Status: DC
  Filled 2015-06-16: qty 300

## 2015-06-16 MED ORDER — SODIUM CHLORIDE 0.9% FLUSH: 9.0000 mL | INTRAVENOUS | Status: DC | PRN

## 2015-06-16 MED ORDER — KCL IN DEXTROSE-NACL 20-5-0.45 MEQ/L-%-% IV SOLN
INTRAVENOUS | Status: DC
  Administered 2015-06-16: 20:00:00 via INTRAVENOUS
  Filled 2015-06-16 (×2): qty 1000

## 2015-06-16 MED ORDER — PHENYLEPHRINE HCL 10 MG/ML IJ SOLN
INTRAMUSCULAR | Status: AC
  Filled 2015-06-16: qty 2

## 2015-06-16 MED ORDER — SODIUM BICARBONATE 8.4 % IV SOLN
INTRAVENOUS | Status: AC
  Filled 2015-06-16: qty 50

## 2015-06-16 MED ORDER — ONDANSETRON 4 MG PO TBDP
4.0000 mg | ORAL_TABLET | Freq: Four times a day (QID) | ORAL | Status: DC | PRN
  Filled 2015-06-16: qty 1

## 2015-06-16 MED ORDER — ONDANSETRON HCL 4 MG/2ML IJ SOLN
4.0000 mg | Freq: Four times a day (QID) | INTRAMUSCULAR | Status: DC | PRN
  Filled 2015-06-16: qty 2

## 2015-06-16 MED ORDER — EVICEL 5 ML EX KIT
PACK | CUTANEOUS | Status: DC | PRN
  Administered 2015-06-16: 5 mL

## 2015-06-16 MED ORDER — DEXTROSE 50 % IV SOLN
INTRAVENOUS | Status: AC
  Filled 2015-06-16: qty 50

## 2015-06-16 MED ORDER — PHENYLEPHRINE HCL 10 MG/ML IJ SOLN
0.0000 ug/min | INTRAMUSCULAR | Status: DC
  Administered 2015-06-16: 60 ug/min via INTRAVENOUS
  Filled 2015-06-16 (×3): qty 2

## 2015-06-16 MED ORDER — FENTANYL CITRATE (PF) 250 MCG/5ML IJ SOLN
INTRAMUSCULAR | Status: AC
  Filled 2015-06-16: qty 5

## 2015-06-16 MED ORDER — ACETAMINOPHEN 325 MG PO TABS
650.0000 mg | ORAL_TABLET | Freq: Four times a day (QID) | ORAL | Status: DC | PRN
  Administered 2015-06-20 – 2015-06-25 (×2): 650 mg via ORAL
  Filled 2015-06-16 (×2): qty 2

## 2015-06-16 MED ORDER — DEXTROSE 5 % IV SOLN
10.0000 mg | INTRAVENOUS | Status: DC | PRN
  Administered 2015-06-16: 40 ug/min via INTRAVENOUS
  Administered 2015-06-16: 17:00:00 via INTRAVENOUS

## 2015-06-16 MED ORDER — ACETAMINOPHEN 650 MG RE SUPP: 650.0000 mg | Freq: Four times a day (QID) | RECTAL | Status: DC | PRN

## 2015-06-16 MED ORDER — FENTANYL CITRATE (PF) 100 MCG/2ML IJ SOLN
50.0000 ug | Freq: Once | INTRAMUSCULAR | Status: AC
  Administered 2015-06-20: 50 ug via INTRAVENOUS

## 2015-06-16 MED ORDER — FENTANYL CITRATE (PF) 100 MCG/2ML IJ SOLN
INTRAMUSCULAR | Status: DC | PRN
  Administered 2015-06-16 (×4): 50 ug via INTRAVENOUS
  Administered 2015-06-16: 150 ug via INTRAVENOUS
  Administered 2015-06-16: 50 ug via INTRAVENOUS
  Administered 2015-06-16: 100 ug via INTRAVENOUS
  Administered 2015-06-16: 150 ug via INTRAVENOUS

## 2015-06-16 MED ORDER — CEFAZOLIN SODIUM-DEXTROSE 2-4 GM/100ML-% IV SOLN
INTRAVENOUS | Status: AC
  Filled 2015-06-16: qty 100

## 2015-06-16 MED ORDER — EVICEL 2 ML EX KIT
PACK | CUTANEOUS | Status: AC
  Filled 2015-06-16: qty 1

## 2015-06-16 MED ORDER — LACTATED RINGERS IV SOLN
INTRAVENOUS | Status: DC
Start: 2015-06-16 — End: 2015-06-26
  Administered 2015-06-16 (×4): via INTRAVENOUS

## 2015-06-16 MED ORDER — SODIUM CHLORIDE 0.9 % IV SOLN
25.0000 ug/h | INTRAVENOUS | Status: DC
  Administered 2015-06-16: 50 ug/h via INTRAVENOUS
  Filled 2015-06-16: qty 50

## 2015-06-16 MED ORDER — 0.9 % SODIUM CHLORIDE (POUR BTL) OPTIME
TOPICAL | Status: DC | PRN
  Administered 2015-06-16: 1000 mL

## 2015-06-16 MED ORDER — ALBUMIN HUMAN 5 % IV SOLN
INTRAVENOUS | Status: AC
  Filled 2015-06-16: qty 250

## 2015-06-16 MED ORDER — CALCIUM CHLORIDE 10 % IV SOLN
INTRAVENOUS | Status: AC
  Filled 2015-06-16: qty 10

## 2015-06-16 MED ORDER — LABETALOL HCL 5 MG/ML IV SOLN
INTRAVENOUS | Status: DC | PRN
  Administered 2015-06-16 (×2): 5 mg via INTRAVENOUS

## 2015-06-16 MED ORDER — BUPIVACAINE-EPINEPHRINE (PF) 0.25% -1:200000 IJ SOLN
INTRAMUSCULAR | Status: AC
  Filled 2015-06-16: qty 30

## 2015-06-16 MED ORDER — MIDAZOLAM HCL 5 MG/5ML IJ SOLN
INTRAMUSCULAR | Status: DC | PRN
  Administered 2015-06-16: 2 mg via INTRAVENOUS

## 2015-06-16 MED ORDER — CALCIUM CHLORIDE 10 % IV SOLN
INTRAVENOUS | Status: DC | PRN
  Administered 2015-06-16 (×2): 250 mg via INTRAVENOUS
  Administered 2015-06-16: 450 mg via INTRAVENOUS
  Administered 2015-06-16: 250 mg via INTRAVENOUS

## 2015-06-16 MED ORDER — DEXTROSE-NACL 5-0.9 % IV SOLN
INTRAVENOUS | Status: DC
  Administered 2015-06-16: 22:00:00 via INTRAVENOUS
  Administered 2015-06-17: 100 mL/h via INTRAVENOUS

## 2015-06-16 MED ORDER — FENTANYL CITRATE (PF) 250 MCG/5ML IJ SOLN
INTRAMUSCULAR | Status: AC
  Filled 2015-06-16: qty 10

## 2015-06-16 MED ORDER — BUPIVACAINE ON-Q PAIN PUMP (FOR ORDER SET NO CHG)
INJECTION | Status: AC
  Filled 2015-06-16: qty 1

## 2015-06-16 MED ORDER — EPHEDRINE SULFATE 50 MG/ML IJ SOLN
INTRAMUSCULAR | Status: DC | PRN
  Administered 2015-06-16 (×2): 15 mg via INTRAVENOUS

## 2015-06-16 MED ORDER — FENTANYL BOLUS VIA INFUSION
50.0000 ug | INTRAVENOUS | Status: DC | PRN
  Filled 2015-06-16: qty 50

## 2015-06-16 MED ORDER — EVICEL 5 ML EX KIT
PACK | CUTANEOUS | Status: AC
  Filled 2015-06-16: qty 1

## 2015-06-16 MED ORDER — SIMETHICONE 80 MG PO CHEW
40.0000 mg | CHEWABLE_TABLET | Freq: Four times a day (QID) | ORAL | Status: DC | PRN
  Administered 2015-06-20 – 2015-06-25 (×2): 40 mg via ORAL
  Filled 2015-06-16 (×2): qty 1

## 2015-06-16 MED ORDER — SODIUM BICARBONATE 8.4 % IV SOLN
50.0000 meq | Freq: Once | INTRAVENOUS | Status: AC
  Administered 2015-06-16: 50 meq via INTRAVENOUS

## 2015-06-16 MED ORDER — NALOXONE HCL 0.4 MG/ML IJ SOLN: 0.4000 mg | INTRAMUSCULAR | Status: DC | PRN

## 2015-06-16 MED ORDER — LIDOCAINE HCL (CARDIAC) 20 MG/ML IV SOLN
INTRAVENOUS | Status: DC | PRN
  Administered 2015-06-16: 90 mg via INTRAVENOUS

## 2015-06-16 MED ORDER — PANTOPRAZOLE SODIUM 40 MG IV SOLR
40.0000 mg | Freq: Every day | INTRAVENOUS | Status: DC
  Administered 2015-06-16 – 2015-06-23 (×8): 40 mg via INTRAVENOUS
  Filled 2015-06-16 (×7): qty 40

## 2015-06-16 MED ORDER — ONDANSETRON HCL 4 MG/2ML IJ SOLN: 4.0000 mg | Freq: Four times a day (QID) | INTRAMUSCULAR | Status: DC | PRN

## 2015-06-16 MED ORDER — SUCCINYLCHOLINE CHLORIDE 20 MG/ML IJ SOLN
INTRAMUSCULAR | Status: DC | PRN
  Administered 2015-06-16: 120 mg via INTRAVENOUS

## 2015-06-16 MED ORDER — ALBUMIN HUMAN 5 % IV SOLN
25.0000 g | Freq: Once | INTRAVENOUS | Status: AC
  Administered 2015-06-16: 25 g via INTRAVENOUS

## 2015-06-16 MED ORDER — INSULIN ASPART 100 UNIT/ML ~~LOC~~ SOLN
10.0000 [IU] | Freq: Once | SUBCUTANEOUS | Status: AC
  Administered 2015-06-16: 10 [IU] via INTRAVENOUS

## 2015-06-16 MED ORDER — LIDOCAINE 2% (20 MG/ML) 5 ML SYRINGE
INTRAMUSCULAR | Status: AC
  Filled 2015-06-16: qty 10

## 2015-06-16 MED ORDER — PROPOFOL 1000 MG/100ML IV EMUL
INTRAVENOUS | Status: AC
  Filled 2015-06-16: qty 100

## 2015-06-16 MED ORDER — MANNITOL 25 % IV SOLN
25.0000 g | Freq: Once | INTRAVENOUS | Status: AC
  Administered 2015-06-16: 12.5 g via INTRAVENOUS
  Filled 2015-06-16 (×2): qty 100

## 2015-06-16 MED ORDER — ALBUMIN HUMAN 5 % IV SOLN
INTRAVENOUS | Status: DC | PRN
  Administered 2015-06-16 (×3): via INTRAVENOUS

## 2015-06-16 MED ORDER — PROPOFOL 500 MG/50ML IV EMUL
INTRAVENOUS | Status: DC | PRN
  Administered 2015-06-16: 50 ug/kg/min via INTRAVENOUS

## 2015-06-16 MED ORDER — SODIUM CHLORIDE 0.9 % IV SOLN
10.0000 mL/h | Freq: Once | INTRAVENOUS | Status: AC
  Administered 2015-06-16 (×2): via INTRAVENOUS

## 2015-06-16 MED ORDER — HYDROMORPHONE 1 MG/ML IV SOLN
INTRAVENOUS | Status: DC
  Administered 2015-06-17: 2.1 mg via INTRAVENOUS
  Administered 2015-06-17: 0.3 mg via INTRAVENOUS
  Administered 2015-06-17: 2.7 mg via INTRAVENOUS
  Administered 2015-06-18: 2.8 mg via INTRAVENOUS
  Administered 2015-06-18: 1.5 mg via INTRAVENOUS
  Administered 2015-06-18: 0 mg via INTRAVENOUS
  Administered 2015-06-19: 1 mg via INTRAVENOUS
  Administered 2015-06-19: 3 mg via INTRAVENOUS
  Administered 2015-06-19: 0.6 mg via INTRAVENOUS
  Administered 2015-06-19: 2.4 mg via INTRAVENOUS
  Administered 2015-06-20: 0.9 mg via INTRAVENOUS
  Filled 2015-06-16 (×2): qty 25

## 2015-06-16 MED ORDER — PROPOFOL 10 MG/ML IV BOLUS
INTRAVENOUS | Status: AC
  Filled 2015-06-16: qty 20

## 2015-06-16 SURGICAL SUPPLY — 123 items
ATTRACTOMAT 16X20 MAGNETIC DRP (DRAPES) IMPLANT
BLADE 10 SAFETY STRL DISP (BLADE) IMPLANT
BLADE SURG 10 STRL SS (BLADE) IMPLANT
BLADE SURG ROTATE 9660 (MISCELLANEOUS) ×4 IMPLANT
CANISTER SUCTION 2500CC (MISCELLANEOUS) ×4 IMPLANT
CATH KIT ON-Q SILVERSOAK 7.5IN (CATHETERS) ×8 IMPLANT
CHLORAPREP W/TINT 26ML (MISCELLANEOUS) ×4 IMPLANT
CLIP LIGATING HEM O LOK PURPLE (MISCELLANEOUS) ×4 IMPLANT
CLIP LIGATING HEMO O LOK GREEN (MISCELLANEOUS) ×4 IMPLANT
CLIP LIGATING HEMOLOK MED (MISCELLANEOUS) ×4 IMPLANT
CLIP TI LARGE 6 (CLIP) ×12 IMPLANT
CLIP TI MEDIUM 24 (CLIP) ×4 IMPLANT
CLIP TI MEDIUM 6 (CLIP) ×4 IMPLANT
CLIP TI WIDE RED SMALL 24 (CLIP) ×8 IMPLANT
CONT SPEC 4OZ CLIKSEAL STRL BL (MISCELLANEOUS) ×8 IMPLANT
COVER SURGICAL LIGHT HANDLE (MISCELLANEOUS) ×4 IMPLANT
CUTTER FLEX LINEAR 45M (STAPLE) ×4 IMPLANT
DECANTER SPIKE VIAL GLASS SM (MISCELLANEOUS) ×4 IMPLANT
DOPPLER CAUTERY SUPPRESSOR (INSTRUMENTS) IMPLANT
DRAIN CHANNEL 10F 3/8 F FF (DRAIN) IMPLANT
DRAIN CHANNEL 19F RND (DRAIN) IMPLANT
DRAIN PENROSE 1/2X36 STERILE (WOUND CARE) IMPLANT
DRAPE LAPAROSCOPIC ABDOMINAL (DRAPES) IMPLANT
DRAPE LAPAROTOMY TRNSV 102X78 (DRAPE) IMPLANT
DRAPE TABLE COVER HEAVY DUTY (DRAPES) IMPLANT
DRAPE UTILITY XL STRL (DRAPES) IMPLANT
DRAPE WARM FLUID 44X44 (DRAPE) ×4 IMPLANT
DRSG COVADERM 4X10 (GAUZE/BANDAGES/DRESSINGS) IMPLANT
DRSG COVADERM 4X14 (GAUZE/BANDAGES/DRESSINGS) ×4 IMPLANT
DRSG COVADERM 4X8 (GAUZE/BANDAGES/DRESSINGS) ×4 IMPLANT
DRSG TELFA 3X8 NADH (GAUZE/BANDAGES/DRESSINGS) ×4 IMPLANT
ELECT BLADE 6.5 EXT (BLADE) ×4 IMPLANT
ELECT PAD DSPR THERM+ ADLT (MISCELLANEOUS) ×4 IMPLANT
ELECT REM PT RETURN 9FT ADLT (ELECTROSURGICAL) ×4
ELECTRODE REM PT RTRN 9FT ADLT (ELECTROSURGICAL) ×2 IMPLANT
EVACUATOR SILICONE 100CC (DRAIN) ×4 IMPLANT
GAUZE SPONGE 4X4 12PLY STRL (GAUZE/BANDAGES/DRESSINGS) IMPLANT
GAUZE SPONGE 4X4 16PLY XRAY LF (GAUZE/BANDAGES/DRESSINGS) IMPLANT
GLOVE BIO SURGEON STRL SZ 6 (GLOVE) ×8 IMPLANT
GLOVE BIO SURGEON STRL SZ7 (GLOVE) ×12 IMPLANT
GLOVE BIOGEL M 8.0 STRL (GLOVE) ×4 IMPLANT
GLOVE BIOGEL PI IND STRL 7.0 (GLOVE) ×6 IMPLANT
GLOVE BIOGEL PI IND STRL 8 (GLOVE) ×2 IMPLANT
GLOVE BIOGEL PI INDICATOR 7.0 (GLOVE) ×6
GLOVE BIOGEL PI INDICATOR 8 (GLOVE) ×2
GLOVE INDICATOR 6.5 STRL GRN (GLOVE) ×8 IMPLANT
GLOVE ORTHO TXT STRL SZ7.5 (GLOVE) ×4 IMPLANT
GLOVE SURG SS PI 6.5 STRL IVOR (GLOVE) ×4 IMPLANT
GOWN STRL REUS W/ TWL LRG LVL3 (GOWN DISPOSABLE) ×6 IMPLANT
GOWN STRL REUS W/TWL 2XL LVL3 (GOWN DISPOSABLE) ×4 IMPLANT
GOWN STRL REUS W/TWL LRG LVL3 (GOWN DISPOSABLE) ×6
HAND PENCIL TRP OPTION (MISCELLANEOUS) ×4 IMPLANT
HANDLE SUCTION POOLE (INSTRUMENTS) ×2 IMPLANT
HEMOSTAT SNOW SURGICEL 2X4 (HEMOSTASIS) IMPLANT
KIT BASIN OR (CUSTOM PROCEDURE TRAY) ×4 IMPLANT
KIT ROOM TURNOVER OR (KITS) ×4 IMPLANT
LOOP VESSEL MAXI BLUE (MISCELLANEOUS) ×4 IMPLANT
LOOP VESSEL MINI RED (MISCELLANEOUS) ×8 IMPLANT
NS IRRIG 1000ML POUR BTL (IV SOLUTION) ×12 IMPLANT
PACK GENERAL/GYN (CUSTOM PROCEDURE TRAY) ×4 IMPLANT
PACK STRL LINEN MCOR STD COMBO (GOWNS) IMPLANT
PAD ARMBOARD 7.5X6 YLW CONV (MISCELLANEOUS) ×8 IMPLANT
PENCIL BUTTON HOLSTER BLD 10FT (ELECTRODE) IMPLANT
POUCH SPECIMEN RETRIEVAL 10MM (ENDOMECHANICALS) ×4 IMPLANT
PROBE LASER ARGON (MISCELLANEOUS) IMPLANT
RELOAD 45 VASCULAR/THIN (ENDOMECHANICALS) ×40 IMPLANT
RELOAD BLUE (STAPLE) IMPLANT
RELOAD WHITE ECR60W (STAPLE) IMPLANT
SCALPEL HARMONIC ACE (MISCELLANEOUS) IMPLANT
SCISSORS HARMONIC WAVE 18CM (INSTRUMENTS) ×4 IMPLANT
SEALANT SURGICAL APPL DUAL CAN (MISCELLANEOUS) IMPLANT
SET IRRIG TUBING LAPAROSCOPIC (IRRIGATION / IRRIGATOR) ×4 IMPLANT
SLEEVE ENDOPATH XCEL 5M (ENDOMECHANICALS) ×8 IMPLANT
SPONGE GAUZE 4X4 12PLY STER LF (GAUZE/BANDAGES/DRESSINGS) ×4 IMPLANT
SPONGE LAP 18X18 X RAY DECT (DISPOSABLE) ×12 IMPLANT
STAPLE ECHEON FLEX 60 POW ENDO (STAPLE) IMPLANT
STAPLER ECHELON FLEX (STAPLE) IMPLANT
STAPLER STANDARD HANDLE (STAPLE) IMPLANT
STAPLER VISISTAT 35W (STAPLE) IMPLANT
SUCTION POOLE HANDLE (INSTRUMENTS) ×4
SURGIFLO W/THROMBIN 8M KIT (HEMOSTASIS) ×4 IMPLANT
SUT CHROMIC 2 0 UR 5 27 (SUTURE) IMPLANT
SUT ETHILON 1 LR 30 (SUTURE) IMPLANT
SUT ETHILON 2 0 FS 18 (SUTURE) ×4 IMPLANT
SUT ETHILON 3 0 PS 1 (SUTURE) IMPLANT
SUT MNCRL AB 4-0 PS2 18 (SUTURE) IMPLANT
SUT PDS AB 1 CTX 36 (SUTURE) ×8 IMPLANT
SUT PDS AB 1 TP1 96 (SUTURE) ×12 IMPLANT
SUT PDS AB 4-0 RB1 27 (SUTURE) ×20 IMPLANT
SUT PDS II 0 TP-1 LOOPED 60 (SUTURE) IMPLANT
SUT PROLENE 3 0 SH DA (SUTURE) ×16 IMPLANT
SUT PROLENE 4 0 RB 1 (SUTURE) ×2
SUT PROLENE 4-0 RB1 18X2 ARM (SUTURE) ×2 IMPLANT
SUT SILK 0 TIES 10X30 (SUTURE) IMPLANT
SUT SILK 2 0 SH CR/8 (SUTURE) ×4 IMPLANT
SUT SILK 2 0 TIES 10X30 (SUTURE) ×4 IMPLANT
SUT VIC AB 2-0 CT1 27 (SUTURE)
SUT VIC AB 2-0 CT1 TAPERPNT 27 (SUTURE) IMPLANT
SUT VIC AB 2-0 SH 18 (SUTURE) ×4 IMPLANT
SUT VIC AB 3-0 SH 18 (SUTURE) ×4 IMPLANT
SUT VIC AB 4-0 SH 27 (SUTURE) ×2
SUT VIC AB 4-0 SH 27XBRD (SUTURE) ×2 IMPLANT
SUT VICRYL AB 2 0 TIES (SUTURE) IMPLANT
SUT VICRYL AB 3 0 TIES (SUTURE) IMPLANT
SYS LAPSCP GELPORT 120MM (MISCELLANEOUS)
SYSTEM LAPSCP GELPORT 120MM (MISCELLANEOUS) IMPLANT
TAPE CLOTH SURG 4X10 WHT LF (GAUZE/BANDAGES/DRESSINGS) ×4 IMPLANT
TAPE UMBILICAL COTTON 1/8X30 (MISCELLANEOUS) ×4 IMPLANT
TOWEL OR 17X24 6PK STRL BLUE (TOWEL DISPOSABLE) IMPLANT
TOWEL OR 17X26 10 PK STRL BLUE (TOWEL DISPOSABLE) ×8 IMPLANT
TRAY FOLEY CATH 14FRSI W/METER (CATHETERS) IMPLANT
TRAY FOLEY CATH 16FRSI W/METER (SET/KITS/TRAYS/PACK) ×4 IMPLANT
TRAY LAPAROSCOPIC MC (CUSTOM PROCEDURE TRAY) ×4 IMPLANT
TROCAR XCEL 12X100 BLDLESS (ENDOMECHANICALS) IMPLANT
TROCAR XCEL BLUNT TIP 100MML (ENDOMECHANICALS) IMPLANT
TROCAR XCEL NON-BLD 5MMX100MML (ENDOMECHANICALS) ×4 IMPLANT
TUBE CONNECTING 12'X1/4 (SUCTIONS) ×1
TUBE CONNECTING 12X1/4 (SUCTIONS) ×3 IMPLANT
TUBING FILTER THERMOFLATOR (ELECTROSURGICAL) ×4 IMPLANT
TUBING INSUFFLATION (TUBING) IMPLANT
TUNNELER SHEATH ON-Q 11GX8 (MISCELLANEOUS) ×4 IMPLANT
WATER STERILE IRR 1000ML POUR (IV SOLUTION) IMPLANT
YANKAUER SUCT BULB TIP NO VENT (SUCTIONS) ×4 IMPLANT

## 2015-06-16 NOTE — Transfer of Care (Signed)
Immediate Anesthesia Transfer of Care Note  Patient: Joe Allen  Procedure(s) Performed: Procedure(s): EXPLORATORY LAPAROTOMY, RIGHT  HEPATECTOMY (Right) RIGHT OPEN PARTIAL NEPHRECTOMY  (N/A)  Patient Location: SICU  Anesthesia Type:General  Level of Consciousness: Patient remains intubated per anesthesia plan  Airway & Oxygen Therapy: Patient remains intubated per anesthesia plan and Patient placed on Ventilator (see vital sign flow sheet for setting)  Post-op Assessment: Report given to RN and Post -op Vital signs reviewed and stable  Post vital signs: Reviewed and stable  Last Vitals:  Filed Vitals:   05/25/2015 0926 06/21/2015 1850  BP: 143/84 125/81  Pulse: 79 90  Temp: 37.2 C   Resp: 18 14    Last Pain: There were no vitals filed for this visit.    Patients Stated Pain Goal: 3 ([id number])  Complications: No apparent anesthesia complications

## 2015-06-16 NOTE — Anesthesia Preprocedure Evaluation (Addendum)
Anesthesia Evaluation  Patient identified by MRN, date of birth, ID band Patient awake    Reviewed: Allergy & Precautions, H&P , NPO status , Patient's Chart, lab work & pertinent test results  Airway Mallampati: II  TM Distance: >3 FB Neck ROM: full    Dental  (+) Dental Advisory Given, Edentulous Upper   Pulmonary neg pulmonary ROS, former smoker,    Pulmonary exam normal breath sounds clear to auscultation       Cardiovascular hypertension, Pt. on medications Normal cardiovascular exam Rhythm:regular Rate:Normal     Neuro/Psych negative neurological ROS  negative psych ROS   GI/Hepatic negative GI ROS, GERD  Medicated and Controlled,(+) Cirrhosis       , Hepatitis -, CHepatocellular carcinoma   Endo/Other  negative endocrine ROS  Renal/GU negative Renal ROS  negative genitourinary   Musculoskeletal   Abdominal   Peds  Hematology negative hematology ROS (+)   Anesthesia Other Findings   Reproductive/Obstetrics negative OB ROS                            Anesthesia Physical Anesthesia Plan  ASA: III  Anesthesia Plan: General   Post-op Pain Management:    Induction: Intravenous  Airway Management Planned: Oral ETT  Additional Equipment: Arterial line  Intra-op Plan:   Post-operative Plan: Extubation in OR  Informed Consent: I have reviewed the patients History and Physical, chart, labs and discussed the procedure including the risks, benefits and alternatives for the proposed anesthesia with the patient or authorized representative who has indicated his/her understanding and acceptance.   Dental Advisory Given  Plan Discussed with: CRNA and Surgeon  Anesthesia Plan Comments:        Anesthesia Quick Evaluation

## 2015-06-16 NOTE — Op Note (Signed)
PREOPERATIVE DIAGNOSIS:  Hepatocellular cancer.  POSTOPERATIVE DIAGNOSIS:  Same  PROCEDURE PERFORMED:  Diagnostic laparoscopy, intraoperative liver ultrasound, right hepatic lobectomy (with cholecystectomy)  SURGEON:  Stark Klein, MD  ASSISTANT:  Frederich Cha, MD  ANESTHESIA:  General and local.  FINDINGS:  Soft tumor at dome of liver.    SPECIMEN:   Left liver nodule Right liver with gallbladder Portal fat pad Portal lymph node  ESTIMATED BLOOD LOSS:  950 mL.  COMPLICATIONS:  Unknown.  PROCEDURE:  Patient was identified in the holding area and taken to the operating room where he was placed supine on the operating room table.  General endotracheal anesthesia was induced.  The abdomen was prepped and draped in a sterile fashion.  A time-out was performed according to the surgical safety check list.  When all was correct, we continued.  The left subcostal region was anesthetized with local anesthetic and a small 6 mm incision was made with a #11 blade. Optiview port was placed without difficulty.  Pneumoperitoneum was achieved.  The camera was placed in the abdomen and a small nodule was seen on the left liver.  This was biopsied. Frozen was negative.  The lesion in the right lobe was seen.  No evidence of carcinomatosis was seen.   A right subcostal incision was made with the #10 blade extending to the midline.   The subcutaneous tissues were divided with the Bovie and then the fascia was opened with the Bovie as well. There were several spots on the muscle that were coagulated.  The Bookwalter was then placed for assistance with visualization.      There was no sign of any replaced left hepatic artery or replaced right hepatic artery.  The porta was identified and examined. The right hepatic artery was dissected out and divided.  The left hepatic artery was identified.  A vessel loop was passed around the right hepatic artery.  The common bile duct was identified.  The  right hepatic duct was identified and isolated with a vessel loop. The porta was continued to be skeletonized.  A portal node was on top of the portal vein. This was sent for frozen and was negative.  The portal vein was identified and dissected out.  The right portal vein was isolated with a vessel loop.    Attention was then directed to the liver.  The triangular ligament was taken down to the left.  Adhesions of the right lobe were taken off the diaphragm.  The right hepatic vein was dissected.  This started bleeding and required several 3-0 prolene sutures as well as vascular staple loads.    At this point, the right hepatic artery was divided with a 2-0 silk tie and then clipped.  The line of demarcation was seen.  The right portal vein and right hepatic duct were divided as well. The liver was scored at the line of demarcation with the Bovie.  The harmonic scalpel wave was then used to divide the bulk of the liver parenchyma.  The vascular pedicles were divided with vascular staple loads, The liver was taken completely off.   This was then irrigated and the surface of the liver was retracted for Dr. Louis Meckel.   The argon beam coagulator was used to coagulate the liver bed. This also was irrigated.    Dr. Louis Meckel then performed his portion of the procedure.  I assisted with partial right nephrectomy.     There was a small amount of bile seen staining the lap  on the surface of the liver.  4-0 PDS was used to oversew this and to clip it.  There was no additional bile staining seen.  Tisseel was then applied to the liver edge and omentum was placed over the top.  A 19 Fr Blake drain was placed laterally on the right above the kidney.      Two layers of #1 looped PDS suture were created in a running fashion to reapproximate the fascia.  The [place] drain was then in place with a 2-0 nylon.  The OnQ tunneler was used to create a preperitoneal tunnel for the catheters.  The skin was then irrigated  and stapled.   OnQ catheters were placed through the tunneler sheath.  The wounds were cleaned, dried and dressed with Tegaderm.  The patient was  taken to PACU intubated in stable condition.     Stark Klein, MD

## 2015-06-16 NOTE — Interval H&P Note (Signed)
History and Physical Interval Note:  06/05/2015 10:05 AM  Joe Allen  has presented today for surgery, with the diagnosis of [facility] RIGHT RENAL MASS   The various methods of treatment have been discussed with the patient and family. After consideration of risks, benefits and other options for treatment, the patient has consented to  Procedure(s): DIAGNOSTIC LAPAROSCOPIC, possible open RIGHT  HEPATECTOMY (Right) RIGHT OPEN PARTIAL NEPHRECTOMY  (N/A) as a surgical intervention .  The patient's history has been reviewed, patient examined, no change in status, stable for surgery.  I have reviewed the patient's chart and labs.  Questions were answered to the patient's satisfaction.     Peace Noyes

## 2015-06-16 NOTE — Anesthesia Procedure Notes (Signed)
Procedure Name: Intubation Date/Time: 06/07/2015 10:53 AM Performed by: Mariea Clonts Pre-anesthesia Checklist: Emergency Drugs available, Timeout performed, Patient identified, Patient being monitored and Suction available Patient Re-evaluated:Patient Re-evaluated prior to inductionOxygen Delivery Method: Circle system utilized Preoxygenation: Pre-oxygenation with 100% oxygen Intubation Type: IV induction Laryngoscope Size: Miller and 2 Grade View: Grade II Tube type: Oral Tube size: 7.5 mm Number of attempts: 1 Placement Confirmation: ETT inserted through vocal cords under direct vision,  breath sounds checked- equal and bilateral and positive ETCO2 Tube secured with: Tape Dental Injury: Teeth and Oropharynx as per pre-operative assessment

## 2015-06-16 NOTE — Op Note (Signed)
Preoperative diagnosis:  1. Right renal mass   Postoperative diagnosis:  1. same   Procedure: 1. Open right partial nephrectomy  Surgeon: Ardis Hughs, MD Assistant: Dr. Frederich Cha, MD, Dr. Stark Klein, MD  Anesthesia: General  Complications: None  Intraoperative findings: small upper pole renal mass - endophytic.  Removed with grossly negative margins using intraoperative ultrasound to identify margins.   EBL: total blood loss for liver and kidney resection was 1900cc  Specimens: right renal mass  Indication: Joe Allen is a 60 y.o. patient with incidental finding of a small upper pole renal mass as part of his work-up for his liver mass.  After reviewing the management options for treatment, he elected to proceed with the above surgical procedure(s). We have discussed the potential benefits and risks of the procedure, side effects of the proposed treatment, the likelihood of the patient achieving the goals of the procedure, and any potential problems that might occur during the procedure or recuperation. Informed consent has been obtained.  Description of procedure: The patient had undergone a left partial hepatectomy and had a lambda incision.  The anterior aspect of the kidney was exposed at this point.  The retractor had already been placed.  Once the resection of the liver was complete I then began to mobilize the kidney and packed.  The posterior peritoneum to enter into the retroperitoneal space on the lateral aspect and began working the lateral aspect of the kidney inferiorly and superiorly.  Once I was able to mobilize the kidney medially and worked to free the upper pole and spare the adrenal gland.  Once the kidney was mobilized superiorly and laterally I isolated the medially.  I was able to get the skin around the renal artery and renal vein.  We then dissected down through generous fascia and isolated the renal mass using ultrasound guidance.  I then scored  the renal capsule and the determine tumor margins with the assistance of the ultrasound and 5 mg of mannitol wasn't given.  I then clamped the renal artery with two bulldog vascular clamps.      I did incise the renal parenchyma at the renal mass margins using a 15 blade.  Once around the capsule the tumor was then dissected out with Metzenbaum scissors.  The margins were grossly negative at the time of the resection.  Some of the parenchymal vessels which were bleeding at the time or cauterized using the argon beam.  The collecting system had been opened and was reapproximated with 4-0 Vicryl suture.  The parenchyma was then sewn with 3-0 Vicryl in a running fashion.  I then applied surgi-flow and a Surgicel bolster onto the resection bed and performed a renoraphy with 2-0 interrupted Vicryl using 4 stitches.  The vascular clamps were then removed.  The clamp time was 20 minutes.  There was no residual or ongoing bleeding.  The remaining Surgiflo was then used around the renal hilum.  I then closed the perirenal fat over the resection site using a 2-0 Vicryl stitch.  We then irrigated the surgical bed with sterile water.  Closure was then performed in tandem with Dr. Barry Dienes.  See her dictation for more details.  Ardis Hughs, M.D.

## 2015-06-16 NOTE — H&P (Signed)
Joe Allen 05/25/2015 2:45 PM Location: [facility] Patient #: [id number] DOB: Feb 20, 1955 Married / Language: English / Race: White Male   History of Present Illness Stark Klein MD; 05/25/2015 6:19 PM) Patient words: hepatocellular cancer.  The patient is a 60 year old male who presents with hepatic cancer. Patient is a 60 year old male referred by Dr. Burr Medico for consultation regarding a new hepatocellular cancer. The patient had elevated liver function test in the fall and was diagnosed with hepatitis C. He was sent to infectious disease and treated for his hepatitis C. Viral loads came back down to undetectable. He had a ultrasound a year ago and an ultrasound 6 months ago without evidence of liver masses. Follow-up ultrasound several weeks ago showed a 5.3 cm echogenic mass in the right hepatic lobe with blood flow. MRI was performed to follow this up. Based on the MRI appearance, considered to be pathognomonic for hepatocellular carcinoma. The patient did not require biopsy. He denies abdominal pain or weight loss. He is not having any diarrhea. He has no jaundice. 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 HIV has also been negative. He overall has good energy and rides a bike. 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  MRI images and report reviewed 05/15/15 IMPRESSION: 1. Heterogeneously enhancing 5.8 cm mass in the dome of the right hepatic lobe (segment 7 and 8). Imaging findings in the setting of cirrhosis/chronic hepatitis are diagnostic of hepatocellular carcinoma. In not already obtained, multidisciplinary (surgical, oncological and interventional radiology) consultation is recommended. References: Bruix Maury Dus; American Association for the Study of Liver Diseases. Management of hepatocellular carcinoma: an update. Hepatology 2011;53:1020-1022, and [place]  for Liver Transplant Allocation: Standardization of Liver Imaging, Diagnosis, Classification, and Reporting of Hepatocellular Carcinoma1 Radiology: Volume 266: Number 2-February 2013 2. There are additional liver lesions which appear to reflect cysts and hemangiomas. 3. Heterogeneous enhancing 2.7 cm mass in the upper pole of the right kidney worrisome for renal cell carcinoma. 4. These results will be called to the ordering clinician or representative by the Radiologist Assistant, and communication documented in the PACS or zVision Dashboard.  ultrasound 05/11/15 IMPRESSION: Heterogeneous echotexture of hepatic parenchyma is noted consistent with history of hepatic cirrhosis.  Interval development of 5.3 cm echogenic mass in right hepatic lobe with positive blood flow on Doppler concerning for hepatocellular carcinoma. MRI of the liver with and without gadolinium is recommended for further evaluation. These results will be called to the ordering clinician or representative by the Radiologist Assistant, and communication documented in the PACS or zVision Dashboard.  AFP 5.9. Transaminases back to normal.    Other Problems [name], CMA; 05/25/2015 2:45 PM) Alcohol Abuse Back Pain Gastroesophageal Reflux Disease Hemorrhoids Hepatitis High blood pressure  Past Surgical History [name], [employer]; 05/25/2015 2:45 PM) Colon Polyp Removal - Colonoscopy Oral Surgery  Diagnostic Studies History [name], CMA; 05/25/2015 2:45 PM) Colonoscopy within last year  Allergies [name], CMA; 05/25/2015 2:47 PM) Codeine Phosphate *ANALGESICS - OPIOID* Nausea.  Medication History [name], CMA; 05/25/2015 2:47 PM) Lisinopril (20MG  Tablet, Oral qam) Active. Omeprazole (20MG  Capsule DR, Oral qam) Active. Viagra (100MG  Tablet, Oral qam) Active. Medications Reconciled  Social History [name], [place]; 05/25/2015 2:45 PM) Alcohol use Remotely quit alcohol  use. Caffeine use Coffee. Illicit drug use Remotely quit drug use. Tobacco use Former smoker.  Family History [name], [place];  05/25/2015 2:45 PM) Heart Disease Father.    Review of Systems [name] CMA; 05/25/2015 2:45 PM) General Not Present- Appetite Loss, Chills, Fatigue, Fever, Night Sweats, Weight Gain and Weight Loss. Skin Not Present- Change in Wart/Mole, Dryness, Hives, Jaundice, New Lesions, Non-Healing Wounds, Rash and Ulcer. HEENT Not Present- Earache, Hearing Loss, Hoarseness, Nose Bleed, Oral Ulcers, Ringing in the Ears, Seasonal Allergies, Sinus Pain, Sore Throat, Visual Disturbances, Wears glasses/contact lenses and Yellow Eyes. Respiratory Not Present- Bloody sputum, Chronic Cough, Difficulty Breathing, Snoring and Wheezing. Breast Not Present- Breast Mass, Breast Pain, Nipple Discharge and Skin Changes. Cardiovascular Not Present- Chest Pain, Difficulty Breathing Lying Down, Leg Cramps, Palpitations, Rapid Heart Rate, Shortness of Breath and Swelling of Extremities. Gastrointestinal Present- Hemorrhoids. Not Present- Abdominal Pain, Bloating, Bloody Stool, Change in Bowel Habits, Chronic diarrhea, Constipation, Difficulty Swallowing, Excessive gas, Gets full quickly at meals, Indigestion, Nausea, Rectal Pain and Vomiting. Male Genitourinary Not Present- Blood in Urine, Change in Urinary Stream, Frequency, Impotence, Nocturia, Painful Urination, Urgency and Urine Leakage. Musculoskeletal Present- Back Pain and Joint Pain. Not Present- Joint Stiffness, Muscle Pain, Muscle Weakness and Swelling of Extremities. Neurological Not Present- Decreased Memory, Fainting, Headaches, Numbness, Seizures, Tingling, Tremor, Trouble walking and Weakness. Psychiatric Not Present- Anxiety, Bipolar, Change in Sleep Pattern, Depression, Fearful and Frequent crying. Endocrine Not Present- Cold Intolerance, Excessive Hunger, Hair Changes, Heat Intolerance, Hot flashes and New  Diabetes. Hematology Not Present- Easy Bruising, Excessive bleeding, Gland problems, HIV and Persistent Infections.  Vitals [name] CMA; 05/25/2015 2:48 PM) 05/25/2015 2:47 PM Weight: 221.8 lb Height: 70in Body Surface Area: 2.18 m Body Mass Index: 31.82 kg/m  Temp.: 98.5F(Oral)  Pulse: 66 (Regular)  BP: 130/78 (Sitting, Left Arm, Standard)       Physical Exam Stark Klein MD; 05/25/2015 6:07 PM) General Mental Status-Alert. General Appearance-Consistent with stated age. Hydration-Well hydrated. Voice-Normal.  Head and Neck Head-normocephalic, atraumatic with no lesions or palpable masses. Trachea-midline. Thyroid Gland Characteristics - normal size and consistency.  Eye Eyeball - Bilateral-Extraocular movements intact. Sclera/Conjunctiva - Bilateral-No scleral icterus.  Chest and Lung Exam Chest and lung exam reveals -quiet, even and easy respiratory effort with no use of accessory muscles and on auscultation, normal breath sounds, no adventitious sounds and normal vocal resonance. Inspection Chest Wall - Normal. Back - normal.  Cardiovascular Cardiovascular examination reveals -normal heart sounds, regular rate and rhythm with no murmurs and normal pedal pulses bilaterally.  Abdomen Inspection Inspection of the abdomen reveals - No Hernias. Palpation/Percussion Palpation and Percussion of the abdomen reveal - Soft, Non Tender, No Rebound tenderness, No Rigidity (guarding) and No hepatosplenomegaly. Auscultation Auscultation of the abdomen reveals - Bowel sounds normal.  Neurologic Neurologic evaluation reveals -alert and oriented x 3 with no impairment of recent or remote memory. Mental Status-Normal.  Musculoskeletal Global Assessment -Note: no gross deformities.  Normal Exam - Left-Upper Extremity Strength Normal and Lower Extremity Strength Normal. Normal Exam - Right-Upper Extremity Strength Normal and  Lower Extremity Strength Normal.  Lymphatic Head & Neck  General Head & Neck Lymphatics: Bilateral - Description - Normal. Axillary  General Axillary Region: Bilateral - Description - Normal. Tenderness - Non Tender. Femoral & Inguinal  Generalized Femoral & Inguinal Lymphatics: Bilateral - Description - No Generalized lymphadenopathy.    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 He is getting a CT scan of the chest. Barring evidence of metastasis, we will plan  to resect this. I advised the patient that I would start with a diagnostic laparoscopy to exclude carcinomatosis. I reviewed that he would need a formal right hepatectomy based on the imaging. The mass appears to be at the middle hepatic vein. The surgery was discussed with the patient with diagrams of anatomy. I reviewed the rationale for surgery, possible alternative options, possibility of having to abort the procedure, hospital course, post op restrictions, possible post op complications, possible need for post hospital stay at a nursing home or rehab, and possible death. He is accompanied by his wife and a neighbor.  Complications were reviewed with the patient and family. These can include: Bleeding Infection and possible wound complications such as hernia Damage to adjacent structures Leak of bile from the surface of the liver Possible need for other procedures, such as abscess drains in radiology or endoscopy. Possible prolonged hospital stay MOST PATIENTS' ENERGY LEVEL IS NOT BACK TO NORMAL FOR AT LEAST 4-6 MONTHS. OLDER PATIENTS MAY FEEL WEAK FOR LONGER PERIODS OF TIME. Difficulty with eating or post operative nausea (around 30%) Possible early recurrence of cancer Possible complications of your medical problems such as heart disease or arrhythmias. Death (less than 2%)  45 min spent in  evaluation, examination, counseling, and coordination of care. >50% spent in counseling. Current Plans Pt Education - flb hepatectomy: discussed with patient and provided information. You are being scheduled for surgery - Our schedulers will call you.  You should hear from our office's scheduling department within 5 working days about the location, date, and time of surgery. We try to make accommodations for patient's preferences in scheduling surgery, but sometimes the OR schedule or the surgeon's schedule prevents [place] from making those accommodations.  If you have not heard from our office [phone]) in 5 working days, call the office and ask for your surgeon's nurse.  If you have other questions about your diagnosis, plan, or surgery, call the office and ask for your surgeon's nurse.    Signed by Stark Klein, MD (05/25/2015 6:20 PM)

## 2015-06-16 NOTE — Progress Notes (Signed)
CRITICAL VALUE ALERT  Critical value received:  Potassium-6.4  Date of notification:  06/08/2015  Time of notification:  2225  Critical value read back:Yes.    Nurse who received alert:  Thelma Comp  MD notified (1st page):  Lorenso Courier MD  Time of first page:  2225  MD notified (2nd page):  Time of second page:  Responding MD:  Rosendo Gros, MD  Time MD responded:  2225

## 2015-06-17 ENCOUNTER — Encounter (HOSPITAL_COMMUNITY): Payer: Self-pay | Admitting: General Surgery

## 2015-06-17 LAB — COMPREHENSIVE METABOLIC PANEL
ALT: 124 U/L — AB (ref 17–63)
AST: 124 U/L — AB (ref 15–41)
Albumin: 3.3 g/dL — ABNORMAL LOW (ref 3.5–5.0)
Alkaline Phosphatase: 24 U/L — ABNORMAL LOW (ref 38–126)
Anion gap: 7 (ref 5–15)
BUN: 11 mg/dL (ref 6–20)
CHLORIDE: 110 mmol/L (ref 101–111)
CO2: 20 mmol/L — AB (ref 22–32)
CREATININE: 1.09 mg/dL (ref 0.61–1.24)
Calcium: 8.9 mg/dL (ref 8.9–10.3)
GFR calc non Af Amer: >60 mL/min (ref >60)
Glucose, Bld: 185 mg/dL — ABNORMAL HIGH (ref 65–99)
POTASSIUM: 4.5 mmol/L (ref 3.5–5.1)
SODIUM: 137 mmol/L (ref 135–145)
Total Bilirubin: 2.7 mg/dL — ABNORMAL HIGH (ref 0.3–1.2)
Total Protein: 4.8 g/dL — ABNORMAL LOW (ref 6.5–8.1)

## 2015-06-17 LAB — POCT I-STAT 4, (NA,K, GLUC, HGB,HCT)
GLUCOSE: 144 mg/dL — AB (ref 65–99)
HEMATOCRIT: 35 % — AB (ref 39.0–52.0)
HEMOGLOBIN: 11.9 g/dL — AB (ref 13.0–17.0)
POTASSIUM: 4.7 mmol/L (ref 3.5–5.1)
Sodium: 141 mmol/L (ref 135–145)

## 2015-06-17 LAB — CBC
HCT: 34 % — ABNORMAL LOW (ref 39.0–52.0)
Hemoglobin: 11.5 g/dL — ABNORMAL LOW (ref 13.0–17.0)
MCH: 28.7 pg (ref 26.0–34.0)
MCHC: 33.8 g/dL (ref 30.0–36.0)
MCV: 84.8 fL (ref 78.0–100.0)
PLATELETS: 141 10*3/uL — AB (ref 150–400)
RBC: 4.01 MIL/uL — AB (ref 4.22–5.81)
RDW: 13.8 % (ref 11.5–15.5)
WBC: 17.9 10*3/uL — AB (ref 4.0–10.5)

## 2015-06-17 LAB — GLUCOSE, CAPILLARY
GLUCOSE-CAPILLARY: 144 mg/dL — AB (ref 65–99)
GLUCOSE-CAPILLARY: 108 mg/dL — AB (ref 65–99)
Glucose-Capillary: 147 mg/dL — ABNORMAL HIGH (ref 65–99)
Glucose-Capillary: 107 mg/dL — ABNORMAL HIGH (ref 65–99)
Glucose-Capillary: 135 mg/dL — ABNORMAL HIGH (ref 65–99)

## 2015-06-17 LAB — POCT I-STAT 7, (LYTES, BLD GAS, ICA,H+H)
Acid-base deficit: 7 mmol/L — ABNORMAL HIGH (ref 0.0–2.0)
BICARBONATE: 19.3 meq/L — AB (ref 20.0–24.0)
Calcium, Ion: 1.29 mmol/L — ABNORMAL HIGH (ref 1.12–1.23)
HCT: 31 % — ABNORMAL LOW (ref 39.0–52.0)
Hemoglobin: 10.5 g/dL — ABNORMAL LOW (ref 13.0–17.0)
O2 Saturation: 99 %
PCO2 ART: 37.4 mmHg (ref 35.0–45.0)
PO2 ART: 162 mmHg — AB (ref 80.0–100.0)
Potassium: 4.7 mmol/L (ref 3.5–5.1)
Sodium: 139 mmol/L (ref 135–145)
TCO2: 20 mmol/L (ref 0–100)
pH, Arterial: 7.316 — ABNORMAL LOW (ref 7.350–7.450)

## 2015-06-17 LAB — POCT I-STAT 3, ART BLOOD GAS (G3+)
Acid-base deficit: 4 mmol/L — ABNORMAL HIGH (ref 0.0–2.0)
Acid-base deficit: 6 mmol/L — ABNORMAL HIGH (ref 0.0–2.0)
Acid-base deficit: 9 mmol/L — ABNORMAL HIGH (ref 0.0–2.0)
BICARBONATE: 18.8 meq/L — AB (ref 20.0–24.0)
BICARBONATE: 20 meq/L (ref 20.0–24.0)
Bicarbonate: 20.9 mEq/L (ref 20.0–24.0)
O2 Saturation: 97 %
O2 Saturation: 99 %
O2 Saturation: 99 %
PCO2 ART: 39.9 mmHg (ref 35.0–45.0)
PCO2 ART: 46.2 mmHg — AB (ref 35.0–45.0)
PH ART: 7.217 — AB (ref 7.350–7.450)
TCO2: 20 mmol/L (ref 0–100)
TCO2: 21 mmol/L (ref 0–100)
TCO2: 22 mmol/L (ref 0–100)
pCO2 arterial: 36.9 mmHg (ref 35.0–45.0)
pH, Arterial: 7.309 — ABNORMAL LOW (ref 7.350–7.450)
pH, Arterial: 7.359 (ref 7.350–7.450)
pO2, Arterial: 172 mmHg — ABNORMAL HIGH (ref 80.0–100.0)
pO2, Arterial: 190 mmHg — ABNORMAL HIGH (ref 80.0–100.0)
pO2, Arterial: 86 mmHg (ref 80.0–100.0)

## 2015-06-17 LAB — POTASSIUM: Potassium: 4.6 mmol/L (ref 3.5–5.1)

## 2015-06-17 LAB — PROTIME-INR
INR: 1.77 — AB (ref 0.00–1.49)
Prothrombin Time: 20.6 seconds — ABNORMAL HIGH (ref 11.6–15.2)

## 2015-06-17 LAB — MAGNESIUM: Magnesium: 1.5 mg/dL — ABNORMAL LOW (ref 1.7–2.4)

## 2015-06-17 LAB — MRSA PCR SCREENING: MRSA BY PCR: NEGATIVE

## 2015-06-17 LAB — PHOSPHORUS: PHOSPHORUS: 3.1 mg/dL (ref 2.5–4.6)

## 2015-06-17 MED ORDER — ALBUMIN HUMAN 5 % IV SOLN
INTRAVENOUS | Status: AC
  Filled 2015-06-17: qty 250

## 2015-06-17 MED ORDER — MAGNESIUM SULFATE 4 GM/100ML IV SOLN
INTRAVENOUS | Status: AC
  Filled 2015-06-17: qty 100

## 2015-06-17 MED ORDER — MAGNESIUM SULFATE 4 GM/100ML IV SOLN
4.0000 g | Freq: Once | INTRAVENOUS | Status: AC
  Administered 2015-06-17: 4 g via INTRAVENOUS

## 2015-06-17 MED ORDER — ALBUMIN HUMAN 25 % IV SOLN
25.0000 g | Freq: Four times a day (QID) | INTRAVENOUS | Status: AC
  Administered 2015-06-17 – 2015-06-19 (×7): 25 g via INTRAVENOUS
  Filled 2015-06-17: qty 100
  Filled 2015-06-17 (×2): qty 50
  Filled 2015-06-17 (×2): qty 100

## 2015-06-17 MED ORDER — SODIUM CHLORIDE 0.9 % IV SOLN
Freq: Once | INTRAVENOUS | Status: AC
  Administered 2015-06-25 (×4): via INTRAVENOUS

## 2015-06-17 MED ORDER — ALBUMIN HUMAN 5 % IV SOLN
INTRAVENOUS | Status: AC
  Administered 2015-06-17: 12.5 g
  Filled 2015-06-17: qty 500

## 2015-06-17 NOTE — Progress Notes (Signed)
1 Day Post-Op  Subjective: Calm overnight.  Acidosis corrected.    Objective: Vital signs in last 24 hours: Temp:  [97.4 F (36.3 C)-98.9 F (37.2 C)] 97.8 F (36.6 C) (05/24 0400) Pulse Rate:  [79-97] 89 (05/24 0507) Resp:  [12-20] 20 (05/24 0507) BP: (82-143)/(60-89) 98/72 mmHg (05/24 0500) SpO2:  [96 %-100 %] 100 % (05/24 0507) Arterial Line BP: (87-137)/(51-86) 95/70 mmHg (05/24 0500) FiO2 (%):  [50 %-100 %] 50 % (05/24 0507) Weight:  [95 kg (209 lb 7 oz)-99.247 kg (218 lb 12.8 oz)] 95 kg (209 lb 7 oz) (05/24 0200)    Intake/Output from previous day: 05/23 0701 - 05/24 0700 In: 7768.4 [I.V.:5589.4; Blood:979; NG/GT:90; IV Piggyback:1110] Out: 3070 [Urine:805; Emesis/NG output:100; Drains:265; Blood:1900] Intake/Output this shift: Total I/O In: 2039.4 [I.V.:1589.4; NG/GT:90; IV Piggyback:360] Out: 835 [Urine:470; Emesis/NG output:100; Drains:265]  General appearance: intubated, sedated Resp: breathing comfortably on the vent Cardio: regular rate and rhythm GI: soft, non distended.  dressings c/d/i.    Lab Results:   Recent Labs  05/31/2015 2100 06/17/15 0300  WBC 27.4* 17.9*  HGB 13.7 11.5*  HCT 41.0 34.0*  PLT 176 141*   BMET  Recent Labs  06/07/2015 2100  06/17/15 0022 06/17/15 0300  NA 135  --   --  137  K 6.4*  < > 4.6 4.5  CL 110  --   --  110  CO2 20*  --   --  20*  GLUCOSE 218*  --   --  185*  BUN 11  --   --  11  CREATININE 1.25*  --   --  1.09  CALCIUM 8.6*  --   --  8.9  < > = values in this interval not displayed. PT/INR  Recent Labs  06/21/2015 2100 06/17/15 0300  LABPROT 18.5* 20.6*  INR 1.53* 1.77*   ABG  Recent Labs  06/17/15 0024 06/17/15 0233  PHART 7.217* 7.309*  HCO3 18.8* 20.0    Studies/Results: No results found.  Anti-infectives: Anti-infectives    Start     Dose/Rate Route Frequency Ordered Stop   05/29/2015 2000  ceFAZolin (ANCEF) IVPB 2g/100 mL premix     2 g 200 mL/hr over 30 Minutes Intravenous Every 8 hours  06/09/2015 1911 05/25/2015 2009   06/17/2015 1030  ceFAZolin (ANCEF) IVPB 2g/100 mL premix     2 g 200 mL/hr over 30 Minutes Intravenous To ShortStay Surgical 06/15/15 1425 06/22/2015 1452      Assessment/Plan: s/p Procedure(s): EXPLORATORY LAPAROTOMY, RIGHT  HEPATECTOMY (Right) RIGHT OPEN PARTIAL NEPHRECTOMY  (N/A) Continue foley due to urinary output monitoring wean to extubate  25% albumin for 48 hours given volume of liver removed. 2 u ffp for elevated INR and hypovolemia.   LOS: 1 day    [facility] 06/17/2015

## 2015-06-17 NOTE — Progress Notes (Signed)
Urology Inpatient Progress Report [place]  1 Day Post-Op S/p right partial nephrectomy and left partial hepatectomy  Intv/Subj: Intubated from surgery Acidotic and hyperkalemic - corrected Following commands   Past Medical History  Diagnosis Date  . Hypertension   . Chronic hepatitis C ([place])   . Hypertension   . GERD (gastroesophageal reflux disease)   . Cancer [facility])     liver cancer   Current Facility-Administered Medications  Medication Dose Route Frequency Provider Last Rate Last Dose  . acetaminophen (TYLENOL) tablet 650 mg  650 mg Oral Q6H PRN Stark Klein, MD       Or  . acetaminophen (TYLENOL) suppository 650 mg  650 mg Rectal Q6H PRN Stark Klein, MD      . bisacodyl (DULCOLAX) EC tablet 5 mg  5 mg Oral Daily PRN Stark Klein, MD      . bupivacaine ON-Q pain pump   Other Continuous Stark Klein, MD      . dextrose 5 %-0.9 % sodium chloride infusion   Intravenous Continuous Ralene Ok, MD 100 mL/hr at 06/22/2015 2200    . diphenhydrAMINE (BENADRYL) injection 12.5 mg  12.5 mg Intravenous Q6H PRN Stark Klein, MD       Or  . diphenhydrAMINE (BENADRYL) 12.5 MG/5ML elixir 12.5 mg  12.5 mg Oral Q6H PRN Stark Klein, MD      . fentaNYL (SUBLIMAZE) 2,500 mcg in sodium chloride 0.9 % 250 mL (10 mcg/mL) infusion  25-400 mcg/hr Intravenous Continuous Stark Klein, MD 5 mL/hr at 06/10/2015 2000 50 mcg/hr at 06/09/2015 2000  . fentaNYL (SUBLIMAZE) bolus via infusion 50 mcg  50 mcg Intravenous Q1H PRN Stark Klein, MD      . fentaNYL (SUBLIMAZE) injection 50 mcg  50 mcg Intravenous Once Stark Klein, MD   50 mcg at 06/15/2015 1915  . hydrALAZINE (APRESOLINE) injection 10 mg  10 mg Intravenous Q2H PRN Stark Klein, MD      . HYDROmorphone (DILAUDID) 1 mg/mL PCA injection   Intravenous Q4H Stark Klein, MD   Stopped at 05/29/2015 2000  . lactated ringers infusion   Intravenous Continuous Stark Klein, MD 50 mL/hr at 06/08/2015 1003    . methocarbamol (ROBAXIN) tablet 500 mg  500 mg Oral Q6H PRN  Stark Klein, MD      . naloxone [facility] injection 0.4 mg  0.4 mg Intravenous PRN Stark Klein, MD       And  . sodium chloride flush (NS) 0.9 % injection 9 mL  9 mL Intravenous PRN Stark Klein, MD      . ondansetron (ZOFRAN) injection 4 mg  4 mg Intravenous Q6H PRN Stark Klein, MD      . ondansetron (ZOFRAN-ODT) disintegrating tablet 4 mg  4 mg Oral Q6H PRN Stark Klein, MD       Or  . ondansetron (ZOFRAN) injection 4 mg  4 mg Intravenous Q6H PRN Stark Klein, MD      . pantoprazole (PROTONIX) injection 40 mg  40 mg Intravenous QHS Stark Klein, MD   40 mg at 06/11/2015 2112  . phenylephrine (NEO-SYNEPHRINE) 20 mg in dextrose 5 % 250 mL (0.08 mg/mL) infusion  0-400 mcg/min Intravenous Titrated Ralene Ok, MD 15 mL/hr at 06/17/15 0300 20 mcg/min at 06/17/15 0300  . propofol (DIPRIVAN) 1000 MG/100ML infusion  5-70 mcg/kg/min Intravenous Titrated Stark Klein, MD 11.9 mL/hr at 06/17/15 0315 20 mcg/kg/min at 06/17/15 0315  . senna (SENOKOT) tablet 8.6 mg  1 tablet Oral BID Stark Klein, MD   8.6 mg  at 06/21/2015 2200  . simethicone (MYLICON) chewable tablet 40 mg  40 mg Oral Q6H PRN Stark Klein, MD         Objective: Vital: Filed Vitals:   06/17/15 0430 06/17/15 0445 06/17/15 0500 06/17/15 0507  BP: 103/71 96/69 98/72    Pulse: 91 92 88 89  Temp:      TempSrc:      Resp: 20 20 20 20   Height:      Weight:      SpO2: 100% 100% 100% 100%   I/Os: I/O last 3 completed shifts: In: [id number] [I.V.:4000; Blood:979; IV Piggyback:750] Out: 2235 [Urine:335; Blood:1900]  Physical Exam:  General: intubated/sedated GI: The abdomen is soft, incision is c/d/i Foley draining clear urine Ext: lower extremities symmetric  Lab Results:  Recent Labs  06/22/2015 1629 06/06/2015 2100 06/17/15 0300  WBC  --  27.4* 17.9*  HGB 11.2* 13.7 11.5*  HCT 33.0* 41.0 34.0*    Recent Labs  06/09/2015 1629 05/31/2015 2100 06/18/2015 2220 06/17/15 0022 06/17/15 0300  NA 138 135  --   --  137  K 4.6 6.4*  6.0* 4.6 4.5  CL  --  110  --   --  110  CO2  --  20*  --   --  20*  GLUCOSE  --  218*  --   --  185*  BUN  --  11  --   --  11  CREATININE  --  1.25*  --   --  1.09  CALCIUM  --  8.6*  --   --  8.9    Recent Labs  06/19/2015 2100 06/17/15 0300  INR 1.53* 1.77*   No results for input(s): LABURIN in the last 72 hours. Results for orders placed or performed during the hospital encounter of 05/28/2015  MRSA PCR Screening     Status: None   Collection Time: 06/03/2015 11:51 PM  Result Value Ref Range Status   MRSA by PCR NEGATIVE NEGATIVE Final    Comment:        The GeneXpert MRSA Assay (FDA approved for NASAL specimens only), is one component of a comprehensive MRSA colonization surveillance program. It is not intended to diagnose MRSA infection nor to guide or monitor treatment for MRSA infections.     Studies/Results: No results found.  Assessment: 1 Day Post-Op  Plan: Stable Wean to extubate PCA and on-q pump for pain D/c foley once wake and alert Activity ad lib FFP to correct INR Albumin to help volume/BP Replete lytes PRN SQH/PPI prophylaxis    LOS: 1 day  Ardis Hughs 06/17/2015, 6:29 AM

## 2015-06-17 NOTE — Progress Notes (Addendum)
Wasted 125 ml of IV Fentanyl infusion with Hyui, RN as witness. Bobette Mo

## 2015-06-17 NOTE — Procedures (Signed)
Extubation Procedure Note  Patient Details:   Name: Joe Allen DOB: December 27, 1955 MRN: [id number]   Airway Documentation:     Evaluation  O2 sats: stable throughout Complications: No apparent complications Patient did tolerate procedure well. Bilateral Breath Sounds: Clear   Yes   NIF -30, VC 1.6L, positive cuff leak, patient able to vocalize and clear secretions  Saunders Glance 06/17/2015, 10:56 AM

## 2015-06-17 NOTE — Anesthesia Postprocedure Evaluation (Signed)
Anesthesia Post Note  Patient: Joe Allen  Procedure(s) Performed: Procedure(s) (LRB): EXPLORATORY LAPAROTOMY, RIGHT  HEPATECTOMY (Right) RIGHT OPEN PARTIAL NEPHRECTOMY  (N/A)  Patient location during evaluation: SICU Anesthesia Type: General Level of consciousness: sedated and patient remains intubated per anesthesia plan Pain management: pain level controlled Vital Signs Assessment: post-procedure vital signs reviewed and stable Respiratory status: patient remains intubated per anesthesia plan and patient on ventilator - see flowsheet for VS Cardiovascular status: stable Anesthetic complications: no    Last Vitals:  Filed Vitals:   06/17/15 0500 06/17/15 0507  BP: 98/72   Pulse: 88 89  Temp:    Resp: 20 20    Last Pain: There were no vitals filed for this visit.               Catalina Gravel

## 2015-06-18 LAB — GLUCOSE, CAPILLARY
GLUCOSE-CAPILLARY: 123 mg/dL — AB (ref 65–99)
Glucose-Capillary: 129 mg/dL — ABNORMAL HIGH (ref 65–99)
Glucose-Capillary: 139 mg/dL — ABNORMAL HIGH (ref 65–99)
Glucose-Capillary: 140 mg/dL — ABNORMAL HIGH (ref 65–99)
Glucose-Capillary: 153 mg/dL — ABNORMAL HIGH (ref 65–99)
Glucose-Capillary: 173 mg/dL — ABNORMAL HIGH (ref 65–99)

## 2015-06-18 LAB — CBC
HEMATOCRIT: 23.9 % — AB (ref 39.0–52.0)
HEMOGLOBIN: 8 g/dL — AB (ref 13.0–17.0)
MCH: 29.6 pg (ref 26.0–34.0)
MCHC: 33.5 g/dL (ref 30.0–36.0)
MCV: 88.5 fL (ref 78.0–100.0)
Platelets: 82 10*3/uL — ABNORMAL LOW (ref 150–400)
RBC: 2.7 MIL/uL — ABNORMAL LOW (ref 4.22–5.81)
RDW: 14.6 % (ref 11.5–15.5)
WBC: 11.2 10*3/uL — ABNORMAL HIGH (ref 4.0–10.5)

## 2015-06-18 LAB — COMPREHENSIVE METABOLIC PANEL
ALK PHOS: 31 U/L — AB (ref 38–126)
ALT: 139 U/L — ABNORMAL HIGH (ref 17–63)
ANION GAP: 7 (ref 5–15)
AST: 161 U/L — ABNORMAL HIGH (ref 15–41)
Albumin: 3.9 g/dL (ref 3.5–5.0)
BILIRUBIN TOTAL: 3.6 mg/dL — AB (ref 0.3–1.2)
BUN: 18 mg/dL (ref 6–20)
CALCIUM: 9 mg/dL (ref 8.9–10.3)
CO2: 26 mmol/L (ref 22–32)
Chloride: 108 mmol/L (ref 101–111)
Creatinine, Ser: 1.15 mg/dL (ref 0.61–1.24)
GFR calc non Af Amer: >60 mL/min (ref >60)
Glucose, Bld: 142 mg/dL — ABNORMAL HIGH (ref 65–99)
Potassium: 4.7 mmol/L (ref 3.5–5.1)
SODIUM: 141 mmol/L (ref 135–145)
TOTAL PROTEIN: 5.3 g/dL — AB (ref 6.5–8.1)

## 2015-06-18 LAB — PREPARE FRESH FROZEN PLASMA
UNIT DIVISION: 0
Unit division: 0

## 2015-06-18 LAB — PROTIME-INR
INR: 2.29 — AB (ref 0.00–1.49)
INR: 1.69 — ABNORMAL HIGH (ref 0.00–1.49)
Prothrombin Time: 25 seconds — ABNORMAL HIGH (ref 11.6–15.2)
Prothrombin Time: 19.9 seconds — ABNORMAL HIGH (ref 11.6–15.2)

## 2015-06-18 LAB — MAGNESIUM: MAGNESIUM: 2.3 mg/dL (ref 1.7–2.4)

## 2015-06-18 MED ORDER — DEXTROSE 5 % IV SOLN
10.0000 mg | Freq: Once | INTRAVENOUS | Status: AC
Start: 2015-06-18 — End: 2015-06-18
  Administered 2015-06-18: 10 mg via INTRAVENOUS
  Filled 2015-06-18: qty 1

## 2015-06-18 MED ORDER — SODIUM CHLORIDE 0.9 % IV SOLN
Freq: Once | INTRAVENOUS | Status: DC
Start: 2015-06-18 — End: 2015-06-26

## 2015-06-18 NOTE — Progress Notes (Signed)
I have written for transfusion 4 units of FFP.  Will advance his diet to clears.

## 2015-06-18 NOTE — Progress Notes (Signed)
2 Days Post-Op  Subjective: Pt doing well this AM. Pain well controlled.  Happy to have liquids.  Objective: Vital signs in last 24 hours: Temp:  [97.6 F (36.4 C)-99 F (37.2 C)] 98.4 F (36.9 C) (05/25 0721) Pulse Rate:  [91-132] 120 (05/25 0400) Resp:  [8-24] 19 (05/25 0400) BP: (101-151)/(62-96) 122/70 mmHg (05/25 0604) SpO2:  [89 %-100 %] 95 % (05/25 0400) Arterial Line BP: (93-150)/(76-83) 98/83 mmHg (05/24 1145) FiO2 (%):  [40 %] 40 % (05/24 0915) Weight:  [98.1 kg (216 lb 4.3 oz)] 98.1 kg (216 lb 4.3 oz) (05/25 0604)    Intake/Output from previous day: 05/24 0701 - 05/25 0700 In: 3739.4 [P.O.:60; I.V.:2479.4; Blood:420; NG/GT:30; IV Piggyback:750] Out: [id number] [Urine:970; Emesis/NG output:400; Drains:360] Intake/Output this shift:    General appearance: alert and cooperative Resp: clear to auscultation bilaterally Cardio: regular rate and rhythm, S1, S2 normal, no murmur, click, rub or gallop GI: soft, approp ttp, ND,  incision c/d/i  Lab Results:   Recent Labs  06/17/15 0300 06/18/15 0255  WBC 17.9* 11.2*  HGB 11.5* 8.0*  HCT 34.0* 23.9*  PLT 141* 82*   BMET  Recent Labs  06/17/15 0300 06/18/15 0255  NA 137 141  K 4.5 4.7  CL 110 108  CO2 20* 26  GLUCOSE 185* 142*  BUN 11 18  CREATININE 1.09 1.15  CALCIUM 8.9 9.0   PT/INR  Recent Labs  06/17/15 0300 06/18/15 0255  LABPROT 20.6* 25.0*  INR 1.77* 2.29*   ABG  Recent Labs  06/17/15 0233 06/17/15 1038  PHART 7.309* 7.359  HCO3 20.0 20.9    Anti-infectives: Anti-infectives    Start     Dose/Rate Route Frequency Ordered Stop   06/20/2015 2000  ceFAZolin (ANCEF) IVPB 2g/100 mL premix     2 g 200 mL/hr over 30 Minutes Intravenous Every 8 hours 06/18/2015 1911 06/06/2015 2009   06/19/2015 1030  ceFAZolin (ANCEF) IVPB 2g/100 mL premix     2 g 200 mL/hr over 30 Minutes Intravenous To ShortStay Surgical 06/15/15 1425 05/27/2015 1452      Assessment/Plan: s/p Procedure(s): EXPLORATORY  LAPAROTOMY, RIGHT  HEPATECTOMY (Right) RIGHT OPEN PARTIAL NEPHRECTOMY  (N/A) Advance diet to clears, go slow Mobilize out of bed Repeat INR post trx Anemia post op likely dilutional  Hopefully out of ICU tomorrow if con't to do well  LOS: 2 days    Rosario Jacks., [facility] 06/18/2015

## 2015-06-18 NOTE — Progress Notes (Addendum)
Urology Inpatient Progress Report [facility]  2 Days Post-Op S/p right partial nephrectomy and right partial hepatectomy  Intv/Subj: Extubated No acute events C/o being thirsty - little complaints of pain.  No Flatus   Past Medical History  Diagnosis Date  . Hypertension   . Chronic hepatitis C ([place])   . Hypertension   . GERD (gastroesophageal reflux disease)   . Cancer [facility])     liver cancer   Current Facility-Administered Medications  Medication Dose Route Frequency Provider Last Rate Last Dose  . 0.9 %  sodium chloride infusion   Intravenous Once Stark Klein, MD      . acetaminophen (TYLENOL) tablet 650 mg  650 mg Oral Q6H PRN Stark Klein, MD       Or  . acetaminophen (TYLENOL) suppository 650 mg  650 mg Rectal Q6H PRN Stark Klein, MD      . albumin human 25 % solution 25 g  25 g Intravenous Q6H Stark Klein, MD   25 g at 06/18/15 0535  . bisacodyl (DULCOLAX) EC tablet 5 mg  5 mg Oral Daily PRN Stark Klein, MD      . bupivacaine ON-Q pain pump   Other Continuous Stark Klein, MD      . dextrose 5 %-0.9 % sodium chloride infusion   Intravenous Continuous Ralene Ok, MD 100 mL/hr at 06/17/15 1823 100 mL/hr at 06/17/15 1823  . diphenhydrAMINE (BENADRYL) injection 12.5 mg  12.5 mg Intravenous Q6H PRN Stark Klein, MD       Or  . diphenhydrAMINE (BENADRYL) 12.5 MG/5ML elixir 12.5 mg  12.5 mg Oral Q6H PRN Stark Klein, MD      . fentaNYL (SUBLIMAZE) 2,500 mcg in sodium chloride 0.9 % 250 mL (10 mcg/mL) infusion  25-400 mcg/hr Intravenous Continuous Stark Klein, MD   Stopped at 06/17/15 1100  . fentaNYL (SUBLIMAZE) bolus via infusion 50 mcg  50 mcg Intravenous Q1H PRN Stark Klein, MD      . fentaNYL (SUBLIMAZE) injection 50 mcg  50 mcg Intravenous Once Stark Klein, MD   50 mcg at 06/15/2015 1915  . hydrALAZINE (APRESOLINE) injection 10 mg  10 mg Intravenous Q2H PRN Stark Klein, MD      . HYDROmorphone (DILAUDID) 1 mg/mL PCA injection   Intravenous Q4H Stark Klein, MD   0.3 mg at  06/17/15 1346  . lactated ringers infusion   Intravenous Continuous Stark Klein, MD 50 mL/hr at 05/30/2015 1003    . methocarbamol (ROBAXIN) tablet 500 mg  500 mg Oral Q6H PRN Stark Klein, MD      . naloxone [facility]) injection 0.4 mg  0.4 mg Intravenous PRN Stark Klein, MD       And  . sodium chloride flush (NS) 0.9 % injection 9 mL  9 mL Intravenous PRN Stark Klein, MD      . ondansetron (ZOFRAN) injection 4 mg  4 mg Intravenous Q6H PRN Stark Klein, MD      . ondansetron (ZOFRAN-ODT) disintegrating tablet 4 mg  4 mg Oral Q6H PRN Stark Klein, MD       Or  . ondansetron (ZOFRAN) injection 4 mg  4 mg Intravenous Q6H PRN Stark Klein, MD      . pantoprazole (PROTONIX) injection 40 mg  40 mg Intravenous QHS Stark Klein, MD   40 mg at 06/17/15 2255  . phenylephrine (NEO-SYNEPHRINE) 20 mg in dextrose 5 % 250 mL (0.08 mg/mL) infusion  0-400 mcg/min Intravenous Titrated Ralene Ok, MD   Stopped at 06/17/15 1100  .  propofol (DIPRIVAN) 1000 MG/100ML infusion  5-70 mcg/kg/min Intravenous Titrated Stark Klein, MD   Stopped at 06/17/15 1000  . senna (SENOKOT) tablet 8.6 mg  1 tablet Oral BID Stark Klein, MD   8.6 mg at 05/30/2015 2200  . simethicone (MYLICON) chewable tablet 40 mg  40 mg Oral Q6H PRN Stark Klein, MD         Objective: Vital: Filed Vitals:   06/18/15 0200 06/18/15 0300 06/18/15 0400 06/18/15 0604  BP: 127/70 124/74 151/74 122/70  Pulse: 126 115 120   Temp:   98 F (36.7 C)   TempSrc:   Oral   Resp: 21 16 19    Height:      Weight:    98.1 kg (216 lb 4.3 oz)  SpO2: 92% 94% 95%    I/Os: I/O last 3 completed shifts: In: 10476.6 [P.O.:60; I.V.:7137.6; Blood:1399; NG/GT:120; IV Piggyback:1760] Out: [id number] [Urine:1285; Emesis/NG output:500; Drains:490; Blood:1900]  Physical Exam:  General: intubated/sedated GI: The abdomen is mildly distended, appropriately tender.  Incision is dressed with little saturation Foley draining clear urine Ext: lower extremities  symmetric  Lab Results:  Recent Labs  05/29/2015 2100 06/17/15 0300 06/18/15 0255  WBC 27.4* 17.9* 11.2*  HGB 13.7 11.5* 8.0*  HCT 41.0 34.0* 23.9*    Recent Labs  05/27/2015 2100  06/17/15 0022 06/17/15 0300 06/18/15 0255  NA 135  --   --  137 141  K 6.4*  < > 4.6 4.5 4.7  CL 110  --   --  110 108  CO2 20*  --   --  20* 26  GLUCOSE 218*  --   --  185* 142*  BUN 11  --   --  11 18  CREATININE 1.25*  --   --  1.09 1.15  CALCIUM 8.6*  --   --  8.9 9.0  < > = values in this interval not displayed.  Recent Labs  06/21/2015 2100 06/17/15 0300 06/18/15 0255  INR 1.53* 1.77* 2.29*   No results for input(s): LABURIN in the last 72 hours. Results for orders placed or performed during the hospital encounter of 06/15/2015  MRSA PCR Screening     Status: None   Collection Time: 06/15/2015 11:51 PM  Result Value Ref Range Status   MRSA by PCR NEGATIVE NEGATIVE Final    Comment:        The GeneXpert MRSA Assay (FDA approved for NASAL specimens only), is one component of a comprehensive MRSA colonization surveillance program. It is not intended to diagnose MRSA infection nor to guide or monitor treatment for MRSA infections.     Studies/Results: No results found.  Assessment: 2 Days Post-Op Hemodynamically stable  - coagulopathic and anemic resulting from extensive liver resection and partial nephrectomy.  Plan: Trend hemoglobin with low threshold for transfusion. Defer FFP transfusion to Dr. Barry Dienes but would keep on bedrest until INR corrected. Diet per gen surg - ADAT? Foley can be discontinued  Continue to monitor drain output Replete lytes PRN SQH/PPI prophylaxis  We will continue to follow.   LOS: 2 days  Ardis Hughs 06/18/2015, 6:44 AM

## 2015-06-19 ENCOUNTER — Inpatient Hospital Stay (HOSPITAL_COMMUNITY): Payer: Commercial Managed Care - PPO

## 2015-06-19 LAB — GLUCOSE, CAPILLARY
GLUCOSE-CAPILLARY: 117 mg/dL — AB (ref 65–99)
GLUCOSE-CAPILLARY: 125 mg/dL — AB (ref 65–99)
GLUCOSE-CAPILLARY: 125 mg/dL — AB (ref 65–99)
GLUCOSE-CAPILLARY: 148 mg/dL — AB (ref 65–99)
Glucose-Capillary: 131 mg/dL — ABNORMAL HIGH (ref 65–99)

## 2015-06-19 LAB — PREPARE FRESH FROZEN PLASMA
UNIT DIVISION: 0
UNIT DIVISION: 0
Unit division: 0
Unit division: 0

## 2015-06-19 LAB — COMPREHENSIVE METABOLIC PANEL
ALBUMIN: 4 g/dL (ref 3.5–5.0)
ALT: 100 U/L — ABNORMAL HIGH (ref 17–63)
AST: 105 U/L — AB (ref 15–41)
Alkaline Phosphatase: 34 U/L — ABNORMAL LOW (ref 38–126)
Anion gap: 4 — ABNORMAL LOW (ref 5–15)
BILIRUBIN TOTAL: 3.3 mg/dL — AB (ref 0.3–1.2)
BUN: 14 mg/dL (ref 6–20)
CO2: 31 mmol/L (ref 22–32)
Calcium: 8.8 mg/dL — ABNORMAL LOW (ref 8.9–10.3)
Chloride: 104 mmol/L (ref 101–111)
Creatinine, Ser: 0.85 mg/dL (ref 0.61–1.24)
GFR calc Af Amer: >60 mL/min (ref >60)
GFR calc non Af Amer: >60 mL/min (ref >60)
GLUCOSE: 135 mg/dL — AB (ref 65–99)
POTASSIUM: 4.2 mmol/L (ref 3.5–5.1)
Sodium: 139 mmol/L (ref 135–145)
TOTAL PROTEIN: 5.4 g/dL — AB (ref 6.5–8.1)

## 2015-06-19 LAB — CBC
HEMATOCRIT: 19.8 % — AB (ref 39.0–52.0)
HEMATOCRIT: 24.3 % — AB (ref 39.0–52.0)
HEMOGLOBIN: 6.5 g/dL — AB (ref 13.0–17.0)
Hemoglobin: 8 g/dL — ABNORMAL LOW (ref 13.0–17.0)
MCH: 29.5 pg (ref 26.0–34.0)
MCH: 29.7 pg (ref 26.0–34.0)
MCHC: 32.8 g/dL (ref 30.0–36.0)
MCHC: 32.9 g/dL (ref 30.0–36.0)
MCV: 90 fL (ref 78.0–100.0)
MCV: 90.3 fL (ref 78.0–100.0)
PLATELETS: 77 10*3/uL — AB (ref 150–400)
Platelets: 70 10*3/uL — ABNORMAL LOW (ref 150–400)
RBC: 2.2 MIL/uL — AB (ref 4.22–5.81)
RBC: 2.69 MIL/uL — ABNORMAL LOW (ref 4.22–5.81)
RDW: 14.6 % (ref 11.5–15.5)
RDW: 14.4 % (ref 11.5–15.5)
WBC: 5.3 10*3/uL (ref 4.0–10.5)
WBC: 5.3 10*3/uL (ref 4.0–10.5)

## 2015-06-19 LAB — PROTIME-INR
INR: 1.71 — AB (ref 0.00–1.49)
INR: 1.9 — ABNORMAL HIGH (ref 0.00–1.49)
PROTHROMBIN TIME: 20.1 s — AB (ref 11.6–15.2)
Prothrombin Time: 21.7 seconds — ABNORMAL HIGH (ref 11.6–15.2)

## 2015-06-19 LAB — POCT I-STAT 3, ART BLOOD GAS (G3+)
ACID-BASE EXCESS: 6 mmol/L — AB (ref 0.0–2.0)
BICARBONATE: 30.5 meq/L — AB (ref 20.0–24.0)
O2 SAT: 95 %
PO2 ART: 73 mmHg — AB (ref 80.0–100.0)
Patient temperature: 98.3
TCO2: 32 mmol/L (ref 0–100)
pCO2 arterial: 44.2 mmHg (ref 35.0–45.0)
pH, Arterial: 7.446 (ref 7.350–7.450)

## 2015-06-19 LAB — PREPARE RBC (CROSSMATCH)

## 2015-06-19 MED ORDER — VITAMIN K1 10 MG/ML IJ SOLN
10.0000 mg | Freq: Once | INTRAMUSCULAR | Status: AC
  Administered 2015-06-19: 10 mg via INTRAVENOUS
  Filled 2015-06-19: qty 1

## 2015-06-19 MED ORDER — FUROSEMIDE 10 MG/ML IJ SOLN
40.0000 mg | Freq: Once | INTRAMUSCULAR | Status: AC
  Administered 2015-06-19: 40 mg via INTRAVENOUS

## 2015-06-19 MED ORDER — KCL IN DEXTROSE-NACL 20-5-0.45 MEQ/L-%-% IV SOLN
INTRAVENOUS | Status: DC
  Administered 2015-06-19: 21:00:00 via INTRAVENOUS
  Administered 2015-06-19: 100 mL via INTRAVENOUS
  Administered 2015-06-21 – 2015-06-24 (×4): via INTRAVENOUS
  Filled 2015-06-19 (×13): qty 1000

## 2015-06-19 MED ORDER — SODIUM CHLORIDE 0.9 % IV SOLN: Freq: Once | INTRAVENOUS | Status: DC

## 2015-06-19 NOTE — Progress Notes (Signed)
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

## 2015-06-19 NOTE — Progress Notes (Signed)
Urology Inpatient Progress Report [place]  3 Days Post-Op S/p right partial nephrectomy and right partial hepatectomy  Intv/Subj: Worsening hypoxia hgb 6.5 Patient to chair yesterday Some nausea with clears   Past Medical History  Diagnosis Date  . Hypertension   . Chronic hepatitis C ([place])   . Hypertension   . GERD (gastroesophageal reflux disease)   . Cancer [facility])     liver cancer   Current Facility-Administered Medications  Medication Dose Route Frequency Provider Last Rate Last Dose  . 0.9 %  sodium chloride infusion   Intravenous Once Stark Klein, MD      . 0.9 %  sodium chloride infusion   Intravenous Once Ardis Hughs, MD      . 0.9 %  sodium chloride infusion   Intravenous Once Georganna Skeans, MD      . acetaminophen (TYLENOL) tablet 650 mg  650 mg Oral Q6H PRN Stark Klein, MD       Or  . acetaminophen (TYLENOL) suppository 650 mg  650 mg Rectal Q6H PRN Stark Klein, MD      . bisacodyl (DULCOLAX) EC tablet 5 mg  5 mg Oral Daily PRN Stark Klein, MD      . bupivacaine ON-Q pain pump   Other Continuous Stark Klein, MD      . dextrose 5 % and 0.45 % NaCl with KCl 20 mEq/L infusion   Intravenous Continuous Ardis Hughs, MD      . diphenhydrAMINE (BENADRYL) injection 12.5 mg  12.5 mg Intravenous Q6H PRN Stark Klein, MD       Or  . diphenhydrAMINE (BENADRYL) 12.5 MG/5ML elixir 12.5 mg  12.5 mg Oral Q6H PRN Stark Klein, MD      . fentaNYL (SUBLIMAZE) 2,500 mcg in sodium chloride 0.9 % 250 mL (10 mcg/mL) infusion  25-400 mcg/hr Intravenous Continuous Stark Klein, MD   Stopped at 06/17/15 1100  . fentaNYL (SUBLIMAZE) bolus via infusion 50 mcg  50 mcg Intravenous Q1H PRN Stark Klein, MD      . fentaNYL (SUBLIMAZE) injection 50 mcg  50 mcg Intravenous Once Stark Klein, MD   50 mcg at 06/04/2015 1915  . furosemide (LASIX) injection 40 mg  40 mg Intravenous Once Ardis Hughs, MD      . hydrALAZINE (APRESOLINE) injection 10 mg  10 mg Intravenous Q2H PRN Stark Klein, MD      . HYDROmorphone (DILAUDID) 1 mg/mL PCA injection   Intravenous Q4H Stark Klein, MD   0.3 mg at 06/17/15 1346  . lactated ringers infusion   Intravenous Continuous Stark Klein, MD 50 mL/hr at 05/30/2015 1003    . methocarbamol (ROBAXIN) tablet 500 mg  500 mg Oral Q6H PRN Stark Klein, MD      . naloxone [facility]) injection 0.4 mg  0.4 mg Intravenous PRN Stark Klein, MD       And  . sodium chloride flush (NS) 0.9 % injection 9 mL  9 mL Intravenous PRN Stark Klein, MD      . ondansetron (ZOFRAN) injection 4 mg  4 mg Intravenous Q6H PRN Stark Klein, MD      . ondansetron (ZOFRAN-ODT) disintegrating tablet 4 mg  4 mg Oral Q6H PRN Stark Klein, MD       Or  . ondansetron (ZOFRAN) injection 4 mg  4 mg Intravenous Q6H PRN Stark Klein, MD      . pantoprazole (PROTONIX) injection 40 mg  40 mg Intravenous QHS Stark Klein, MD   40 mg  at 06/18/15 2157  . phenylephrine (NEO-SYNEPHRINE) 20 mg in dextrose 5 % 250 mL (0.08 mg/mL) infusion  0-400 mcg/min Intravenous Titrated Ralene Ok, MD   Stopped at 06/17/15 1100  . senna (SENOKOT) tablet 8.6 mg  1 tablet Oral BID Stark Klein, MD   8.6 mg at 06/18/15 2157  . simethicone (MYLICON) chewable tablet 40 mg  40 mg Oral Q6H PRN Stark Klein, MD         Objective: Vital: Filed Vitals:   06/19/15 0300 06/19/15 0400 06/19/15 0500 06/19/15 0600  BP: 142/82 131/85 162/94 135/89  Pulse: 104 100 113 111  Temp:  98.4 F (36.9 C)    TempSrc:  Oral    Resp: 8 8 10 8   Height:      Weight:    100 kg (220 lb 7.4 oz)  SpO2: 97% 97% 98% 97%   I/Os: I/O last 3 completed shifts: In: 5536 [P.O.:440; I.V.:3500; Blood:1396; IV Piggyback:200] Out: 2510 [Urine:1940; Drains:570]  Physical Exam:  General: intubated/sedated Lungs: Non-rebreather - lungs expanding symmetrically GI: The abdomen is mildly distended, appropriately tender.  Incision is c/d/i - some blood/ooze in midline portion of incision superiorly Drain is serous Foley draining  clear urine Ext: lower extremities symmetric  Lab Results:  Recent Labs  06/17/15 0300 06/18/15 0255 06/19/15 0449  WBC 17.9* 11.2* 5.3  HGB 11.5* 8.0* 6.5*  HCT 34.0* 23.9* 19.8*    Recent Labs  06/17/15 0300 06/18/15 0255 06/19/15 0449  NA 137 141 139  K 4.5 4.7 4.2  CL 110 108 104  CO2 20* 26 31  GLUCOSE 185* 142* 135*  BUN 11 18 14   CREATININE 1.09 1.15 0.85  CALCIUM 8.9 9.0 8.8*    Recent Labs  06/17/15 0300 06/18/15 0255 06/18/15 1825  INR 1.77* 2.29* 1.69*   No results for input(s): LABURIN in the last 72 hours. Results for orders placed or performed during the hospital encounter of 06/21/2015  MRSA PCR Screening     Status: None   Collection Time: 05/31/2015 11:51 PM  Result Value Ref Range Status   MRSA by PCR NEGATIVE NEGATIVE Final    Comment:        The GeneXpert MRSA Assay (FDA approved for NASAL specimens only), is one component of a comprehensive MRSA colonization surveillance program. It is not intended to diagnose MRSA infection nor to guide or monitor treatment for MRSA infections.     Studies/Results: No results found.  Assessment: 3 Days Post-Op Desaturation likely from volume overload Anemia from dilution and surgical blood loss - INR pending Otherwise looks okay.  Plan: Transfuse 1 unit - trend hgb INR pending Clears Change to MIVF, Lasix 40mg  now CXR Continue to monitor drain output Replete lytes PRN SQH/PPI prophylaxis  We will continue to follow.   LOS: 3 days  Ardis Hughs 06/19/2015, 7:05 AM

## 2015-06-19 NOTE — Clinical Documentation Improvement (Signed)
General Surgery Please update your documentation within the medical record to reflect your response to this query. Thank you  Can the diagnosis of anemia/ " surgical blood loss"  be further specified?  Possible conditions:  Acute Blood Loss Anemia  Expected Acute Blood Loss Anemia  Iron deficiency Anemia  Nutritional anemia, including the nutrition or mineral deficits  Chronic Anemia, including the suspected or known cause  Anemia of chronic disease, including the associated chronic disease state  Other  Clinically Undetermined  Document any associated diagnoses/conditions.  Supporting Information: 05/25/2015 Op Note..."ESTIMATED BLOOD LOSS: 950 mL"...2 u ffp for elevated INR and hypovolemia.".Marland Kitchen. 06/17/15 progr note.Marland KitchenMarland Kitchen"Anemia from dilution and surgical blood loss - INR pending..."  06/18/15 progr note..."transfusion 4 units of FFP"..."Worsening hypoxia hgb 6.5"...  Please exercise your independent, professional judgment when responding. A specific answer is not anticipated or expected.  Thank You,  Ermelinda Das, RN, BSN, [place] Certified Clinical Documentation Specialist [place]: Health Information Management [phone]

## 2015-06-19 NOTE — Progress Notes (Signed)
3 Days Post-Op  Subjective: Pt doing well this PM.  Off NRB on [place] liq better this AM  Objective: Vital signs in last 24 hours: Temp:  [98.1 F (36.7 C)-99.1 F (37.3 C)] 98.3 F (36.8 C) (05/26 1229) Pulse Rate:  [99-122] 99 (05/26 1200) Resp:  [6-21] 19 (05/26 1200) BP: (126-170)/(81-97) 131/94 mmHg (05/26 1200) SpO2:  [90 %-99 %] 96 % (05/26 1200) FiO2 (%):  [40 %] 40 % (05/26 0730) Weight:  [100 kg (220 lb 7.4 oz)] 100 kg (220 lb 7.4 oz) (05/26 0600)    Intake/Output from previous day: 05/25 0701 - 05/26 0700 In: 4236 [P.O.:440; I.V.:2300; Blood:1396; IV Piggyback:100] Out: [id number] [Urine:1400; Drains:390] Intake/Output this shift: Total I/O In: 970.3 [P.O.:240; I.V.:348.3; Blood:282; Other:100] Out: 2175 [Urine:2175]  General appearance: alert and cooperative Resp: clear to auscultation bilaterally Cardio: tachy GI: soft, approp ttp, ND, JP SS  Lab Results:   Recent Labs  06/18/15 0255 06/19/15 0449  WBC 11.2* 5.3  HGB 8.0* 6.5*  HCT 23.9* 19.8*  PLT 82* 70*   BMET  Recent Labs  06/18/15 0255 06/19/15 0449  NA 141 139  K 4.7 4.2  CL 108 104  CO2 26 31  GLUCOSE 142* 135*  BUN 18 14  CREATININE 1.15 0.85  CALCIUM 9.0 8.8*   PT/INR  Recent Labs  06/18/15 1825 06/19/15 0755  LABPROT 19.9* 20.1*  INR 1.69* 1.71*   ABG  Recent Labs  06/17/15 0233 06/17/15 1038  PHART 7.309* 7.359  HCO3 20.0 20.9    Studies/Results: Dg Chest Port 1 View  06/19/2015  CLINICAL DATA:  Hypoxia EXAM: PORTABLE CHEST 1 VIEW COMPARISON:  CT 06/03/2015 FINDINGS: Cardiomegaly with vascular congestion. Diffuse opacities throughout the right lung, most confluent in the right lung base. There appears to be elevation of the right hemidiaphragm. Possible right effusion. No focal opacity on the left. No acute bony abnormality. IMPRESSION: Elevation of the right hemidiaphragm with diffuse right lung airspace opacity, most confluent at the right lung base. This could  represent atelectasis or infiltrate. Suspect small right effusion. Cardiomegaly, vascular congestion. Electronically Signed   By: Rolm Baptise M.D.   On: 06/19/2015 07:54    Anti-infectives: Anti-infectives    Start     Dose/Rate Route Frequency Ordered Stop   06/22/2015 2000  ceFAZolin (ANCEF) IVPB 2g/100 mL premix     2 g 200 mL/hr over 30 Minutes Intravenous Every 8 hours 06/01/2015 1911 06/05/2015 2009   06/18/2015 1030  ceFAZolin (ANCEF) IVPB 2g/100 mL premix     2 g 200 mL/hr over 30 Minutes Intravenous To [facility] 06/15/15 1425 06/23/2015 1452      Assessment/Plan: s/p Procedure(s): EXPLORATORY LAPAROTOMY, RIGHT  HEPATECTOMY (Right) RIGHT OPEN PARTIAL NEPHRECTOMY  (N/A) con't PO as tol, go slow Repeat CBC this PM Trend HCt and INR- labs in AM Mobilize out of bed.   LOS: 3 days    Rosario Jacks., Anne Hahn 06/19/2015

## 2015-06-19 NOTE — Progress Notes (Signed)
CRITICAL VALUE ALERT  Critical value received:  Hemoglobin 6.5  Date of notification:  5/26  Time of notification: 0615  Critical value read back: yes  Nurse who received alert:  Josph Macho   MD notified (1st page): Grandville Silos  Time of first page:  0620  MD notified (2nd page):  Time of second page:  Responding MD:  Grandville Silos  Time MD responded:  [phone]  New orders received at this time, transfuse 1 PRBC

## 2015-06-20 ENCOUNTER — Inpatient Hospital Stay (HOSPITAL_COMMUNITY): Payer: Commercial Managed Care - PPO

## 2015-06-20 ENCOUNTER — Encounter (HOSPITAL_COMMUNITY): Payer: Self-pay | Admitting: *Deleted

## 2015-06-20 LAB — TYPE AND SCREEN
ABO/RH(D): O POS
Antibody Screen: NEGATIVE
UNIT DIVISION: 0
UNIT DIVISION: 0
UNIT DIVISION: 0
UNIT DIVISION: 0
UNIT DIVISION: 0
UNIT DIVISION: 0
Unit division: 0
Unit division: 0

## 2015-06-20 LAB — URINE MICROSCOPIC-ADD ON

## 2015-06-20 LAB — CBC
HEMATOCRIT: 26.2 % — AB (ref 39.0–52.0)
HEMOGLOBIN: 8.7 g/dL — AB (ref 13.0–17.0)
MCH: 29.4 pg (ref 26.0–34.0)
MCHC: 33.2 g/dL (ref 30.0–36.0)
MCV: 88.5 fL (ref 78.0–100.0)
Platelets: 101 10*3/uL — ABNORMAL LOW (ref 150–400)
RBC: 2.96 MIL/uL — ABNORMAL LOW (ref 4.22–5.81)
RDW: 14.1 % (ref 11.5–15.5)
WBC: 5.9 10*3/uL (ref 4.0–10.5)

## 2015-06-20 LAB — COMPREHENSIVE METABOLIC PANEL
ALBUMIN: 3.6 g/dL (ref 3.5–5.0)
ALK PHOS: 34 U/L — AB (ref 38–126)
ALT: 81 U/L — ABNORMAL HIGH (ref 17–63)
AST: 75 U/L — AB (ref 15–41)
Anion gap: 7 (ref 5–15)
BILIRUBIN TOTAL: 5.1 mg/dL — AB (ref 0.3–1.2)
BUN: 10 mg/dL (ref 6–20)
CALCIUM: 8.6 mg/dL — AB (ref 8.9–10.3)
CO2: 27 mmol/L (ref 22–32)
Chloride: 100 mmol/L — ABNORMAL LOW (ref 101–111)
Creatinine, Ser: 0.73 mg/dL (ref 0.61–1.24)
GFR calc Af Amer: >60 mL/min (ref >60)
GFR calc non Af Amer: >60 mL/min (ref >60)
GLUCOSE: 138 mg/dL — AB (ref 65–99)
POTASSIUM: 3.9 mmol/L (ref 3.5–5.1)
SODIUM: 134 mmol/L — AB (ref 135–145)
TOTAL PROTEIN: 5 g/dL — AB (ref 6.5–8.1)

## 2015-06-20 LAB — URINALYSIS, ROUTINE W REFLEX MICROSCOPIC
BILIRUBIN URINE: NEGATIVE
Glucose, UA: NEGATIVE mg/dL
KETONES UR: NEGATIVE mg/dL
Nitrite: NEGATIVE
PROTEIN: NEGATIVE mg/dL
Specific Gravity, Urine: 1.003 — ABNORMAL LOW (ref 1.005–1.030)
pH: 7.5 (ref 5.0–8.0)

## 2015-06-20 LAB — AMMONIA: AMMONIA: 38 umol/L — AB (ref 9–35)

## 2015-06-20 LAB — PROTIME-INR
INR: 1.88 — AB (ref 0.00–1.49)
PROTHROMBIN TIME: 21.6 s — AB (ref 11.6–15.2)

## 2015-06-20 LAB — TROPONIN I: TROPONIN I: 0.03 ng/mL (ref <0.031)

## 2015-06-20 MED ORDER — DIATRIZOATE MEGLUMINE & SODIUM 66-10 % PO SOLN
ORAL | Status: AC
  Filled 2015-06-20: qty 30

## 2015-06-20 MED ORDER — FENTANYL CITRATE (PF) 100 MCG/2ML IJ SOLN
25.0000 ug | INTRAMUSCULAR | Status: DC | PRN
Start: 2015-06-20 — End: 2015-06-25
  Administered 2015-06-20 – 2015-06-22 (×8): 50 ug via INTRAVENOUS
  Administered 2015-06-22: 25 ug via INTRAVENOUS
  Administered 2015-06-22 – 2015-06-25 (×11): 50 ug via INTRAVENOUS
  Filled 2015-06-20 (×22): qty 2

## 2015-06-20 MED ORDER — HYDROMORPHONE HCL 1 MG/ML IJ SOLN
1.0000 mg | INTRAMUSCULAR | Status: DC | PRN
  Administered 2015-06-20 – 2015-06-22 (×8): 1 mg via INTRAVENOUS
  Filled 2015-06-20 (×8): qty 1

## 2015-06-20 MED ORDER — HYDROMORPHONE HCL 1 MG/ML IJ SOLN
1.0000 mg | INTRAMUSCULAR | Status: DC | PRN
Start: 2015-06-20 — End: 2015-06-20
  Administered 2015-06-20 (×3): 1 mg via INTRAVENOUS
  Filled 2015-06-20 (×4): qty 1

## 2015-06-20 MED ORDER — LORAZEPAM 2 MG/ML IJ SOLN
INTRAMUSCULAR | Status: AC
  Filled 2015-06-20: qty 1

## 2015-06-20 MED ORDER — NALOXONE HCL 0.4 MG/ML IJ SOLN: 0.4000 mg | INTRAMUSCULAR | Status: DC | PRN

## 2015-06-20 MED ORDER — LORAZEPAM 2 MG/ML IJ SOLN
0.5000 mg | INTRAMUSCULAR | Status: DC
  Administered 2015-06-20: 0.5 mg via INTRAVENOUS

## 2015-06-20 MED ORDER — NALOXONE HCL 0.4 MG/ML IJ SOLN
INTRAMUSCULAR | Status: AC
  Administered 2015-06-20: 0.4 mg
  Filled 2015-06-20: qty 1

## 2015-06-20 MED ORDER — IOPAMIDOL (ISOVUE-300) INJECTION 61%
INTRAVENOUS | Status: AC
  Administered 2015-06-20: 100 mL
  Filled 2015-06-20: qty 100

## 2015-06-20 MED ORDER — LORAZEPAM 2 MG/ML IJ SOLN
0.5000 mg | INTRAMUSCULAR | Status: DC | PRN
  Administered 2015-06-25 (×2): 0.5 mg via INTRAVENOUS
  Filled 2015-06-20 (×2): qty 1

## 2015-06-20 MED ORDER — LACTULOSE 10 GM/15ML PO SOLN
10.0000 g | Freq: Three times a day (TID) | ORAL | Status: DC
  Administered 2015-06-20 (×2): 10 g via ORAL
  Filled 2015-06-20 (×4): qty 15

## 2015-06-20 NOTE — Progress Notes (Signed)
Contacted NP with [facility] about pts continued altered LOC, and concerns from family to include: confusion, and mottled skin.  NP advised she would call attending and have Dr. Brantley Stage come see pt.

## 2015-06-20 NOTE — Plan of Care (Signed)
Problem: Activity: Goal: Ability to tolerate increased activity will improve Outcome: Not Progressing Pt with substantial pain and altered LOC making progression and teaching not plausible

## 2015-06-20 NOTE — Progress Notes (Signed)
Wasted 21mg  of dilaudid PCA in sink and witnessed by Achille Rich RN

## 2015-06-20 NOTE — Plan of Care (Signed)
Problem: Education: Goal: Will regain or maintain usual level of consciousness Outcome: Not Progressing Pt confused, only aware of name and place at times

## 2015-06-20 NOTE — Evaluation (Signed)
Physical Therapy Evaluation Patient Details Name: Altay Jackett MRN: [id number] DOB: 10-Jun-1955 Today's Date: 06/20/2015   History of Present Illness   60 yo male admitted 06/13/2015 forexploratory laparotomy, right hepatectomy, right open partial nephrectomy for hepatic cancer  Clinical Impression  The patient is very flat and does not follow commands. 2 person assist to mobilize from bed to recliner. Patient with appearance of 'staring into space". At times his head falls back. Decreased active gripping of the hands. The RN reports that the patient has been this way  All day.  The wife is not present to gather history about prior level of function.Pt admitted with above diagnosis. Pt currently with functional limitations due to the deficits listed below (see PT Problem List). Pt will benefit from skilled PT to increase their independence and safety with mobility to allow discharge to the venue listed below.       Follow Up Recommendations SNF;Supervision/Assistance - 24 hour    Equipment Recommendations  None recommended by PT    Recommendations for Other Services OT consult     Precautions / Restrictions Precautions Precautions: Fall Precaution Comments: R abd. drain      Mobility  Bed Mobility Overal bed mobility: Needs Assistance;+2 for physical assistance;+ 2 for safety/equipment Bed Mobility: Supine to Sit     Supine to sit: Max assist;+2 for physical assistance;+2 for safety/equipment     General bed mobility comments: tactile and multimodal cues to move legs to edge and assist to sit upright, patient very flat.  Transfers Overall transfer level: Needs assistance Equipment used: 2 person hand held assist Transfers: Sit to/from [facility] Sit to Stand: Max assist;+2 physical assistance;+2 safety/equipment Stand pivot transfers: +2 safety/equipment;+2 physical assistance;Max assist       General transfer comment: patient  is nnot engaging inthe  activity, max cues to weight shift to take a few steps to recliner. Patient would not sit down so strongly flexed the hips  in order for patient to sit down.  Ambulation/Gait                Stairs            Wheelchair Mobility    Modified Rankin (Stroke Patients Only)       Balance Overall balance assessment: Needs assistance Sitting-balance support: Feet supported;Bilateral upper extremity supported Sitting balance-Leahy Scale: Poor     Standing balance support: During functional activity;Bilateral upper extremity supported Standing balance-Leahy Scale: Poor                               Pertinent Vitals/Pain Pain Assessment: Faces Faces Pain Scale: Hurts a little bit Pain Location: abdomen Pain Descriptors / Indicators: Discomfort;Guarding Pain Intervention(s): Monitored during session;Premedicated before session    Home Living   Living Arrangements: Spouse/significant other               Additional Comments: no further info available due to patient not able and wife not present.    Prior Function           Comments: no info available     Hand Dominance        Extremity/Trunk Assessment   Upper Extremity Assessment: Difficult to assess due to impaired cognition;RUE deficits/detail;LUE deficits/detail RUE Deficits / Details: does not grip, does not support with arm in sitting.     LUE Deficits / Details: similar   Lower Extremity Assessment: RLE deficits/detail;LLE deficits/detail RLE Deficits /  Details: bears some weight, shuffling to take steps with max tactile cues an facilitation to move. manually flexed  hips in order to sit down. LLE Deficits / Details: similar to Rt     Communication   Communication: Expressive difficulties;Receptive difficulties (staring in space, repeats his name)  Cognition Arousal/Alertness: Lethargic Behavior During Therapy: Flat affect Overall Cognitive Status: Impaired/Different from  baseline Area of Impairment: Following commands;Awareness               General Comments: Does not follow commands, required tactile cues for activity, no eye contact, very blank face.    General Comments      Exercises        Assessment/Plan    PT Assessment Patient needs continued PT services  PT Diagnosis Difficulty walking;Generalized weakness;Altered mental status   PT Problem List Decreased strength;Decreased activity tolerance;Decreased balance;Decreased mobility;Decreased coordination;Decreased safety awareness;Decreased knowledge of use of DME;Decreased cognition;Decreased knowledge of precautions  PT Treatment Interventions DME instruction;Gait training;Functional mobility training;Therapeutic activities;Therapeutic exercise;Balance training;Cognitive remediation;Patient/family education   PT Goals (Current goals can be found in the Care Plan section) Acute Rehab PT Goals Patient Stated Goal: none PT Goal Formulation: Patient unable to participate in goal setting Time For Goal Achievement: 07/04/15    Frequency Min 3X/week   Barriers to discharge        Co-evaluation               End of Session   Activity Tolerance: Patient limited by lethargy Patient left: in chair;with call bell/phone within reach;with nursing/sitter in room Nurse Communication: Mobility status         Time: [id number] PT Time Calculation (min) (ACUTE ONLY): 18 min   Charges:   PT Evaluation $PT Eval Moderate Complexity: 1 Procedure     PT G CodesClaretha Cooper 06/20/2015, 5:20 PM Tresa Endo PT [phone]

## 2015-06-20 NOTE — Progress Notes (Signed)
Patient ID: Joe Allen, male   DOB: 12-12-1955, 60 y.o.   MRN: [id number]     [facility]      [address] [place], [place] [address]    Phone: [phone] FAX: [phone]     Subjective: Pt sitting up in chair. Mild tachycardia.  Breathing irregular at times. Afebrile.  BP stable.  H&h improved. TB increased from 3.3 to 5.1. CBGs stable.  Good uop.     Objective:  Vital signs:  Filed Vitals:   06/20/15 0300 06/20/15 0400 06/20/15 0600 06/20/15 0714  BP: 139/84 135/90 141/92   Pulse: 108 103 96   Temp:    98.2 F (36.8 C)  TempSrc:    Oral  Resp: 12 10 11    Height:      Weight:   103 kg (227 lb 1.2 oz)   SpO2: 98% 98% 98%        Intake/Output   Yesterday:  05/26 0701 - 05/27 0700 In: 2720.3 [P.O.:240; I.V.:2048.3; Blood:282; IV Piggyback:50] Out: 3875 [Urine:3825; Drains:500] This shift:    I/O last 3 completed shifts: In: 3820.3 [P.O.:240; I.V.:3148.3; Blood:282; Other:100; IV Piggyback:50] Out: 6433 [Urine:4600; Drains:730]       Physical Exam: General: Pt awake/alert to self.  No acute distress.  Eyes: PERRL, normal EOM.  Sclera clear.  No icterus Neuro: CN II-XII intact w/o focal sensory/motor deficits. Chest: cta.  No chest wall pain w good excursion CV:  Pulses intact.  Regular rhythm Abdomen: Soft.  distended. Incision is c/d/i, mild erythema lateral aspect of the wound.  jp drain with serosanguinous output.   No evidence of peritonitis.  No incarcerated hernias. Ext:  +1 pitting edema.  Skin: No petechiae / purpura   Problem List:   Active Problems:   Cancer, hepatocellular ([place])    Results:   Labs: Results for orders placed or performed during the hospital encounter of 06/19/2015 (from the past 48 hour(s))  Glucose, capillary     Status: Abnormal   Collection Time: 06/18/15 11:27 AM  Result Value Ref Range   Glucose-Capillary 123 (H) 65 - 99 mg/dL   Comment 1 Capillary Specimen    Comment 2 Notify RN   Glucose, capillary     Status: Abnormal   Collection Time: 06/18/15  3:07 PM  Result Value Ref Range   Glucose-Capillary 173 (H) 65 - 99 mg/dL   Comment 1 Capillary Specimen    Comment 2 Notify RN   Protime-INR     Status: Abnormal   Collection Time: 06/18/15  6:25 PM  Result Value Ref Range   Prothrombin Time 19.9 (H) 11.6 - 15.2 seconds   INR 1.69 (H) 0.00 - 1.49  Glucose, capillary     Status: Abnormal   Collection Time: 06/18/15  7:17 PM  Result Value Ref Range   Glucose-Capillary 153 (H) 65 - 99 mg/dL   Comment 1 Capillary Specimen    Comment 2 Notify RN    Comment 3 Document in Chart   Glucose, capillary     Status: Abnormal   Collection Time: 06/19/15 12:21 AM  Result Value Ref Range   Glucose-Capillary 117 (H) 65 - 99 mg/dL   Comment 1 Capillary Specimen    Comment 2 Notify RN    Comment 3 Document in Chart   Glucose, capillary     Status: Abnormal   Collection Time: 06/19/15  3:38 AM  Result Value Ref Range   Glucose-Capillary 131 (H) 65 -  99 mg/dL   Comment 1 Capillary Specimen    Comment 2 Notify RN    Comment 3 Document in Chart   CBC     Status: Abnormal   Collection Time: 06/19/15  4:49 AM  Result Value Ref Range   WBC 5.3 4.0 - 10.5 K/uL   RBC 2.20 (L) 4.22 - 5.81 MIL/uL   Hemoglobin 6.5 (LL) 13.0 - 17.0 g/dL    Comment: REPEATED TO VERIFY CRITICAL RESULT CALLED TO, READ BACK BY AND VERIFIED WITH: FRED SMITH RN AT [phone] ON 05.26.2017 BY COCHRANE S    HCT 19.8 (L) 39.0 - 52.0 %   MCV 90.0 78.0 - 100.0 fL   MCH 29.5 26.0 - 34.0 pg   MCHC 32.8 30.0 - 36.0 g/dL   RDW 14.6 11.5 - 15.5 %   Platelets 70 (L) 150 - 400 K/uL    Comment: SPECIMEN CHECKED FOR CLOTS REPEATED TO VERIFY CONSISTENT WITH PREVIOUS RESULT   Comprehensive metabolic panel     Status: Abnormal   Collection Time: 06/19/15  4:49 AM  Result Value Ref Range   Sodium 139 135 - 145 mmol/L   Potassium 4.2 3.5 - 5.1 mmol/L   Chloride 104 101 - 111 mmol/L   CO2 31 22 -  32 mmol/L   Glucose, Bld 135 (H) 65 - 99 mg/dL   BUN 14 6 - 20 mg/dL   Creatinine, Ser 0.85 0.61 - 1.24 mg/dL   Calcium 8.8 (L) 8.9 - 10.3 mg/dL   Total Protein 5.4 (L) 6.5 - 8.1 g/dL   Albumin 4.0 3.5 - 5.0 g/dL   AST 105 (H) 15 - 41 U/L   ALT 100 (H) 17 - 63 U/L   Alkaline Phosphatase 34 (L) 38 - 126 U/L   Total Bilirubin 3.3 (H) 0.3 - 1.2 mg/dL   GFR calc non Af Amer >60 >60 mL/min   GFR calc Af Amer >60 >60 mL/min    Comment: (NOTE) The eGFR has been calculated using the CKD EPI equation. This calculation has not been validated in all clinical situations. eGFR's persistently <60 mL/min signify possible Chronic Kidney Disease.    Anion gap 4 (L) 5 - 15  Prepare RBC     Status: None   Collection Time: 06/19/15  6:26 AM  Result Value Ref Range   Order Confirmation      ORDER PROCESSED BY BLOOD BANK BB SAMPLE OR UNITS ALREADY AVAILABLE  Glucose, capillary     Status: Abnormal   Collection Time: 06/19/15  7:27 AM  Result Value Ref Range   Glucose-Capillary 125 (H) 65 - 99 mg/dL   Comment 1 Capillary Specimen    Comment 2 Notify RN   Protime-INR     Status: Abnormal   Collection Time: 06/19/15  7:55 AM  Result Value Ref Range   Prothrombin Time 20.1 (H) 11.6 - 15.2 seconds   INR 1.71 (H) 0.00 - 1.49  Glucose, capillary     Status: Abnormal   Collection Time: 06/19/15 12:28 PM  Result Value Ref Range   Glucose-Capillary 125 (H) 65 - 99 mg/dL   Comment 1 Capillary Specimen    Comment 2 Notify RN   CBC     Status: Abnormal   Collection Time: 06/19/15  2:26 PM  Result Value Ref Range   WBC 5.3 4.0 - 10.5 K/uL   RBC 2.69 (L) 4.22 - 5.81 MIL/uL   Hemoglobin 8.0 (L) 13.0 - 17.0 g/dL   HCT 24.3 (L) 39.0 -  52.0 %   MCV 90.3 78.0 - 100.0 fL   MCH 29.7 26.0 - 34.0 pg   MCHC 32.9 30.0 - 36.0 g/dL   RDW 14.4 11.5 - 15.5 %   Platelets 77 (L) 150 - 400 K/uL    Comment: REPEATED TO VERIFY PLATELET COUNT CONFIRMED BY SMEAR   Protime-INR     Status: Abnormal   Collection Time:  06/19/15  2:26 PM  Result Value Ref Range   Prothrombin Time 21.7 (H) 11.6 - 15.2 seconds   INR 1.90 (H) 0.00 - 1.49  I-STAT 3, arterial blood gas (G3+)     Status: Abnormal   Collection Time: 06/19/15  2:53 PM  Result Value Ref Range   pH, Arterial 7.446 7.350 - 7.450   pCO2 arterial 44.2 35.0 - 45.0 mmHg   pO2, Arterial 73.0 (L) 80.0 - 100.0 mmHg   Bicarbonate 30.5 (H) 20.0 - 24.0 mEq/L   TCO2 32 0 - 100 mmol/L   O2 Saturation 95.0 %   Acid-Base Excess 6.0 (H) 0.0 - 2.0 mmol/L   Patient temperature 98.3 F    Collection site RADIAL, ALLEN'S TEST ACCEPTABLE    Drawn by Operator    Sample type ARTERIAL   Glucose, capillary     Status: Abnormal   Collection Time: 06/19/15  4:06 PM  Result Value Ref Range   Glucose-Capillary 148 (H) 65 - 99 mg/dL   Comment 1 Capillary Specimen    Comment 2 Notify RN   CBC     Status: Abnormal   Collection Time: 06/20/15  2:47 AM  Result Value Ref Range   WBC 5.9 4.0 - 10.5 K/uL   RBC 2.96 (L) 4.22 - 5.81 MIL/uL   Hemoglobin 8.7 (L) 13.0 - 17.0 g/dL   HCT 26.2 (L) 39.0 - 52.0 %   MCV 88.5 78.0 - 100.0 fL   MCH 29.4 26.0 - 34.0 pg   MCHC 33.2 30.0 - 36.0 g/dL   RDW 14.1 11.5 - 15.5 %   Platelets 101 (L) 150 - 400 K/uL    Comment: CONSISTENT WITH PREVIOUS RESULT  Comprehensive metabolic panel     Status: Abnormal   Collection Time: 06/20/15  2:47 AM  Result Value Ref Range   Sodium 134 (L) 135 - 145 mmol/L   Potassium 3.9 3.5 - 5.1 mmol/L   Chloride 100 (L) 101 - 111 mmol/L   CO2 27 22 - 32 mmol/L   Glucose, Bld 138 (H) 65 - 99 mg/dL   BUN 10 6 - 20 mg/dL   Creatinine, Ser 0.73 0.61 - 1.24 mg/dL   Calcium 8.6 (L) 8.9 - 10.3 mg/dL   Total Protein 5.0 (L) 6.5 - 8.1 g/dL   Albumin 3.6 3.5 - 5.0 g/dL   AST 75 (H) 15 - 41 U/L   ALT 81 (H) 17 - 63 U/L   Alkaline Phosphatase 34 (L) 38 - 126 U/L   Total Bilirubin 5.1 (H) 0.3 - 1.2 mg/dL   GFR calc non Af Amer >60 >60 mL/min   GFR calc Af Amer >60 >60 mL/min    Comment: (NOTE) The eGFR has  been calculated using the CKD EPI equation. This calculation has not been validated in all clinical situations. eGFR's persistently <60 mL/min signify possible Chronic Kidney Disease.    Anion gap 7 5 - 15    Imaging / Studies: Dg Chest Port 1 View  06/19/2015  CLINICAL DATA:  Hypoxia EXAM: PORTABLE CHEST 1 VIEW COMPARISON:  CT 06/03/2015 FINDINGS:  Cardiomegaly with vascular congestion. Diffuse opacities throughout the right lung, most confluent in the right lung base. There appears to be elevation of the right hemidiaphragm. Possible right effusion. No focal opacity on the left. No acute bony abnormality. IMPRESSION: Elevation of the right hemidiaphragm with diffuse right lung airspace opacity, most confluent at the right lung base. This could represent atelectasis or infiltrate. Suspect small right effusion. Cardiomegaly, vascular congestion. Electronically Signed   By: Rolm Baptise M.D.   On: 06/19/2015 07:54    Medications / Allergies:  Scheduled Meds: . sodium chloride   Intravenous Once  . sodium chloride   Intravenous Once  . sodium chloride   Intravenous Once  . fentaNYL (SUBLIMAZE) injection  50 mcg Intravenous Once  . HYDROmorphone   Intravenous Q4H  . pantoprazole (PROTONIX) IV  40 mg Intravenous QHS  . senna  1 tablet Oral BID   Continuous Infusions: . dextrose 5 % and 0.45 % NaCl with KCl 20 mEq/L 100 mL/hr at 06/20/15 0600  . fentaNYL infusion INTRAVENOUS Stopped (06/17/15 1100)  . lactated ringers Stopped (06/19/15 2300)  . phenylephrine (NEO-SYNEPHRINE) Adult infusion Stopped (06/17/15 1100)   PRN Meds:.acetaminophen **OR** acetaminophen, bisacodyl, diphenhydrAMINE **OR** diphenhydrAMINE, fentaNYL, hydrALAZINE, methocarbamol, naloxone **AND** sodium chloride flush, ondansetron (ZOFRAN) IV, ondansetron **OR** ondansetron (ZOFRAN) IV, simethicone  Antibiotics: Anti-infectives    Start     Dose/Rate Route Frequency Ordered Stop   05/31/2015 2000  ceFAZolin (ANCEF) IVPB  2g/100 mL premix     2 g 200 mL/hr over 30 Minutes Intravenous Every 8 hours 06/23/2015 1911 06/15/2015 2009   06/15/2015 1030  ceFAZolin (ANCEF) IVPB 2g/100 mL premix     2 g 200 mL/hr over 30 Minutes Intravenous To [facility] 06/15/15 1425 06/21/2015 1452        Assessment/Plan HCC, right renal mass POD#4 exploratory laparotomy, right hepatectomy, right open partial nephrectomy--Dr. Barry Dienes, Dr. Louis Meckel -continue with clears and await bowel function -mobilize PT eval -continue JP drain -change dressing as needed -no malignancy on liver and gallbladder pathology, significant for cirrhosis.  I discussed this with the wife, MD, please review as well. 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  -? When does OnQ come out? Mental status changes-PCA and no sedation given.  TB up, cirrhosis, ICU.  Will check CTH, UA, chest x ray, ammonia, ekg and troponin Cirrhosis/hep C-s/p treatment for HCV.  Check Ammonia to see if contributing to mental status changes  Heme-coagulopathy, repeat INR.  Anemia is stable.   VTE prophylaxis-SCDs only FEN-reduce IVF Dispo-continue ICU   Erby Pian, ANP-BC [facility]   06/20/2015 8:43 AM

## 2015-06-20 NOTE — Progress Notes (Signed)
Pt unable to drink oral contrast, unable to follow commands--MD notified--verbal order for IV contrast ct abd

## 2015-06-21 LAB — COMPREHENSIVE METABOLIC PANEL
ALBUMIN: 3.4 g/dL — AB (ref 3.5–5.0)
ALT: 70 U/L — ABNORMAL HIGH (ref 17–63)
ANION GAP: 6 (ref 5–15)
AST: 58 U/L — ABNORMAL HIGH (ref 15–41)
Alkaline Phosphatase: 35 U/L — ABNORMAL LOW (ref 38–126)
BILIRUBIN TOTAL: 4.9 mg/dL — AB (ref 0.3–1.2)
BUN: 11 mg/dL (ref 6–20)
CO2: 27 mmol/L (ref 22–32)
Calcium: 8.6 mg/dL — ABNORMAL LOW (ref 8.9–10.3)
Chloride: 103 mmol/L (ref 101–111)
Creatinine, Ser: 0.76 mg/dL (ref 0.61–1.24)
GFR calc Af Amer: >60 mL/min (ref >60)
GFR calc non Af Amer: >60 mL/min (ref >60)
GLUCOSE: 129 mg/dL — AB (ref 65–99)
POTASSIUM: 4.1 mmol/L (ref 3.5–5.1)
Sodium: 136 mmol/L (ref 135–145)
TOTAL PROTEIN: 5.3 g/dL — AB (ref 6.5–8.1)

## 2015-06-21 LAB — URINE CULTURE
CULTURE: NO GROWTH
SPECIAL REQUESTS: NORMAL

## 2015-06-21 LAB — CBC
HEMATOCRIT: 28.1 % — AB (ref 39.0–52.0)
Hemoglobin: 9.4 g/dL — ABNORMAL LOW (ref 13.0–17.0)
MCH: 29.5 pg (ref 26.0–34.0)
MCHC: 33.5 g/dL (ref 30.0–36.0)
MCV: 88.1 fL (ref 78.0–100.0)
PLATELETS: 138 10*3/uL — AB (ref 150–400)
RBC: 3.19 MIL/uL — ABNORMAL LOW (ref 4.22–5.81)
RDW: 14.3 % (ref 11.5–15.5)
WBC: 8.4 10*3/uL (ref 4.0–10.5)

## 2015-06-21 LAB — GLUCOSE, CAPILLARY: GLUCOSE-CAPILLARY: 127 mg/dL — AB (ref 65–99)

## 2015-06-21 LAB — CREATININE, FLUID (PLEURAL, PERITONEAL, JP DRAINAGE): CREAT FL: 0.7 mg/dL

## 2015-06-21 MED ORDER — LACTULOSE ENEMA
300.0000 mL | Freq: Two times a day (BID) | ORAL | Status: DC
  Administered 2015-06-21 (×2): 300 mL via RECTAL
  Filled 2015-06-21 (×3): qty 300

## 2015-06-21 MED ORDER — METOPROLOL TARTRATE 5 MG/5ML IV SOLN
5.0000 mg | Freq: Four times a day (QID) | INTRAVENOUS | Status: DC
  Administered 2015-06-21 – 2015-06-25 (×16): 5 mg via INTRAVENOUS
  Filled 2015-06-21 (×16): qty 5

## 2015-06-21 NOTE — Progress Notes (Signed)
Patient ID: Joe Allen, male   DOB: Jul 15, 1955, 60 y.o.   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   He remains obtunded and that is felt to be from hepatic encephalopathy.   He has good UOP and continues to have significant JP drainage with 239ml out over the last 24 hours.  ROS:  Review of Systems  Unable to perform ROS: mental acuity    Anti-infectives: Anti-infectives    Start     Dose/Rate Route Frequency Ordered Stop   05/30/2015 2000  ceFAZolin (ANCEF) IVPB 2g/100 mL premix     2 g 200 mL/hr over 30 Minutes Intravenous Every 8 hours 05/25/2015 1911 06/03/2015 2009   05/25/2015 1030  ceFAZolin (ANCEF) IVPB 2g/100 mL premix     2 g 200 mL/hr over 30 Minutes Intravenous To [facility] 06/15/15 1425 06/06/2015 1452      Current Facility-Administered Medications  Medication Dose Route Frequency Provider Last Rate Last Dose  . 0.9 %  sodium chloride infusion   Intravenous Once Stark Klein, MD      . 0.9 %  sodium chloride infusion   Intravenous Once Ardis Hughs, MD      . 0.9 %  sodium chloride infusion   Intravenous Once Georganna Skeans, MD      . acetaminophen (TYLENOL) tablet 650 mg  650 mg Oral Q6H PRN Stark Klein, MD   650 mg at 06/20/15 0915   Or  . acetaminophen (TYLENOL) suppository 650 mg  650 mg Rectal Q6H PRN Stark Klein, MD      . bisacodyl (DULCOLAX) EC tablet 5 mg  5 mg Oral Daily PRN Stark Klein, MD      . dextrose 5 % and 0.45 % NaCl with KCl 20 mEq/L infusion   Intravenous Continuous Emina Riebock, NP 50 mL/hr at 06/21/15 0900    . fentaNYL (SUBLIMAZE) injection 25-50 mcg  25-50 mcg Intravenous Q2H PRN Emina Riebock, NP   50 mcg at 06/21/15 0903  . hydrALAZINE (APRESOLINE) injection 10 mg  10 mg Intravenous Q2H PRN Stark Klein, MD      . HYDROmorphone (DILAUDID) injection 1 mg  1 mg Intravenous Q1H PRN Erroll Luna, MD   1 mg at 06/21/15 0607  . lactated ringers  infusion   Intravenous Continuous Stark Klein, MD   Stopped at 06/19/15 2300  . lactulose (CHRONULAC) 10 GM/15ML solution 10 g  10 g Oral TID Erroll Luna, MD   10 g at 06/20/15 1636  . LORazepam (ATIVAN) injection 0.5 mg  0.5 mg Intravenous Q4H PRN Erroll Luna, MD      . methocarbamol (ROBAXIN) tablet 500 mg  500 mg Oral Q6H PRN Stark Klein, MD      . naloxone [facility]) injection 0.4 mg  0.4 mg Intravenous PRN Erroll Luna, MD      . ondansetron (ZOFRAN-ODT) disintegrating tablet 4 mg  4 mg Oral Q6H PRN Stark Klein, MD       Or  . ondansetron (ZOFRAN) injection 4 mg  4 mg Intravenous Q6H PRN Stark Klein, MD      . pantoprazole (PROTONIX) injection 40 mg  40 mg Intravenous QHS Stark Klein, MD   40 mg at 06/20/15 2209  . phenylephrine (NEO-SYNEPHRINE) 20 mg in dextrose 5 % 250 mL (0.08 mg/mL) infusion  0-400 mcg/min Intravenous Titrated Ralene Ok, MD   Stopped at 06/17/15 1100  .  senna (SENOKOT) tablet 8.6 mg  1 tablet Oral BID Stark Klein, MD   8.6 mg at 06/20/15 0915  . simethicone (MYLICON) chewable tablet 40 mg  40 mg Oral Q6H PRN Stark Klein, MD   40 mg at 06/20/15 1636     Objective: Vital signs in last 24 hours: Temp:  [97.5 F (36.4 C)-98.3 F (36.8 C)] 97.5 F (36.4 C) (05/28 0715) Pulse Rate:  [96-123] 121 (05/28 0900) Resp:  [6-15] 11 (05/28 0900) BP: (107-170)/(60-117) 164/113 mmHg (05/28 0900) SpO2:  [94 %-100 %] 100 % (05/28 0900) Weight:  [100.2 kg (220 lb 14.4 oz)] 100.2 kg (220 lb 14.4 oz) (05/28 [id number])  Intake/Output from previous day: 05/27 0701 - 05/28 0700 In: 1370 [P.O.:120; I.V.:1250] Out: 2125 [Urine:1625; Drains:500] Intake/Output this shift: Total I/O In: 100 [I.V.:100] Out: 150 [Urine:150]   Physical Exam  Constitutional:  Obtunded but waking occasionally possibly in pain.   Vitals reviewed.   Lab Results:   Recent Labs  06/20/15 0247 06/21/15 0330  WBC 5.9 8.4  HGB 8.7* 9.4*  HCT 26.2* 28.1*  PLT 101* 138*    BMET  Recent Labs  06/20/15 0247 06/21/15 0330  NA 134* 136  K 3.9 4.1  CL 100* 103  CO2 27 27  GLUCOSE 138* 129*  BUN 10 11  CREATININE 0.73 0.76  CALCIUM 8.6* 8.6*   PT/INR  Recent Labs  06/19/15 1426 06/20/15 0910  LABPROT 21.7* 21.6*  INR 1.90* 1.88*   ABG  Recent Labs  06/19/15 1453  PHART 7.446  HCO3 30.5*    Studies/Results: Dg Chest 2 View  06/20/2015  CLINICAL DATA:  60 year old male with shortness of breath since yesterday. Lethargy and confusion. History of nephrectomy on 06/02/2015. EXAM: CHEST  2 VIEW COMPARISON:  Chest x-ray 06/19/2015. FINDINGS: Lung volumes remain low. Moderate to large right-sided pleural effusion with extensive atelectasis and/or consolidation the base of the right lung. Probable subsegmental atelectasis in the left lower lobe. No evidence of pulmonary edema. No pneumothorax. Heart size appears borderline enlarged. Upper mediastinal contours are distorted by patient's rotation to the right and low lung volumes. Surgical drain and surgical clips projecting over the right upper quadrant of the abdomen, presumably from recent nephrectomy. IMPRESSION: 1. Persistent low lung volumes with moderate to large right pleural effusion and atelectasis and/or airspace consolidation in the base of the right lung. 2. Subsegmental atelectasis in the left lower lobe. Electronically Signed   By: Vinnie Langton M.D.   On: 06/20/2015 10:17   Ct Head Wo Contrast  06/20/2015  CLINICAL DATA:  Mental status change. Status post nephrectomy 4 days ago. EXAM: CT HEAD WITHOUT CONTRAST TECHNIQUE: Contiguous axial images were obtained from the base of the skull through the vertex without intravenous contrast. COMPARISON:  None. FINDINGS: There is no evidence of mass effect, midline shift or extra-axial fluid collections. There is no evidence of a space-occupying lesion or intracranial hemorrhage. There is no evidence of a cortical-based area of acute infarction. The  ventricles and sulci are appropriate for the patient's age. The basal cisterns are patent. Visualized portions of the orbits are unremarkable. The visualized portions of the paranasal sinuses and mastoid air cells are unremarkable. The osseous structures are unremarkable. IMPRESSION: No acute intracranial pathology. Electronically Signed   By: Kathreen Devoid   On: 06/20/2015 11:06   Ct Abdomen Pelvis W Contrast  06/21/2015  CLINICAL DATA:  Status post partial nephrectomy and liver mass removal 4 days ago. Abdominal pain. EXAM: CT  ABDOMEN AND PELVIS WITH CONTRAST TECHNIQUE: Multidetector CT imaging of the abdomen and pelvis was performed using the standard protocol following bolus administration of intravenous contrast. CONTRAST:  163mL ISOVUE-300 IOPAMIDOL (ISOVUE-300) INJECTION 61% COMPARISON:  Abdomen MR dated 05/15/2015 and abdomen ultrasound dated 05/13/2015. Chest radiographs dated 06/20/2015. FINDINGS: Lower chest: Moderate-sized right pleural effusion. Right lower lobe atelectasis. Small, loculated right lateral and medial pneumothorax occupying less than 5% of the volume of the right hemi thorax. This measures 3 mm in maximum thickness. Minimal dependent left lower lobe atelectasis. Hepatobiliary: Surgical absence of the right lobe of the liver hand majority of the medial segment of the left lobe. The remaining lateral segment left lobe and caudate lobe are enlarged. Associated surgical clips. A surgical drain is also in place. The gallbladder is also surgically absent. Pancreas: No mass, inflammatory changes, or other significant abnormality. Spleen: Within normal limits in size and appearance. Adrenals/Urinary Tract: Surgical absence of a portion of the upper pole of the right kidney, superiorly, at the location of the previously demonstrated 2.7 cm mass. The mass is no longer seen. Unremarkable left kidney, adrenal glands, ureters and urinary bladder. The bladder is not distended with a Foley catheter in  place. There is a small amount of associated air in the bladder. Stomach/Bowel: No gastrointestinal abnormalities. Normal appearing appendix. Vascular/Lymphatic: Atheromatous arterial calcifications. No aneurysm or enlarged lymph nodes. Reproductive: Mildly enlarged prostate gland. Other: Minimal free peritoneal fluid. Known abnormal fluid collections. Small bilateral inguinal hernias containing fat. Small umbilical hernia containing fat. Musculoskeletal: Lower thoracic spine degenerative changes. Mild lumbar spine degenerative changes. IMPRESSION: 1. Less than 5% laterally and medially loculated right pneumothorax. 2. Moderate-sized right pleural effusion. 3. Right lower lobe atelectasis. 4. Minimal left lower lobe atelectasis. 5. Status post partial hepatectomy without complicating features. 6. Status post partial upper pole right nephrectomy, without complicating features. 7. Minimal free peritoneal fluid. 8. Small umbilical and bilateral inguinal hernias. Electronically Signed   By: Claudie Revering M.D.   On: 06/21/2015 07:29     Assessment and Plan: S/P right PN for a 2.3cm G2 cystic renal cell CA.   Moderate JP drainage.   Will check JP creatinine for completeness.        LOS: 5 days    Malka So 06/21/2015 [id number]

## 2015-06-21 NOTE — Progress Notes (Signed)
Patient ID: Joe Allen, male   DOB: March 03, 1955, 60 y.o.   MRN: [id number]     [facility]      [address] [place], [place] [address]    Phone: [phone] FAX: [phone]     Subjective: Remains confused. Wife at bedside. Wbc normal.  TB 5.1--4.9 today. H&h up.   Objective:  Vital signs:  Filed Vitals:   06/21/15 0500 06/21/15 0600 06/21/15 0608 06/21/15 0715  BP: 152/86 113/90    Pulse: 100 96    Temp:    97.5 F (36.4 C)  TempSrc:    Axillary  Resp: 6 7    Height:      Weight:   100.2 kg (220 lb 14.4 oz)   SpO2: 100% 100%      Last BM Date:  (before surgery)  Intake/Output   Yesterday:  05/27 0701 - 05/28 0700 In: 1370 [P.O.:120; I.V.:1250] Out: 2125 [Urine:1625; Drains:500] This shift: I/O last 3 completed shifts: In: 2570 [P.O.:120; I.V.:2450] Out: 9937 [Urine:2750; Drains:900]      Physical Exam: General: Pt awake/alert to self. No acute distress.  Eyes: PERRL, normal EOM. Sclera clear. No icterus Neuro: CN II-XII intact w/o focal sensory/motor deficits. Chest: diminished RLL CV: Pulses intact. Regular rhythm Abdomen: Soft. distended. Incision is without erythema or drainage.  jp drain with serosanguinous output. No evidence of peritonitis. No incarcerated hernias. Ext: +1 pitting edema.  Skin: No petechiae / purpura    Problem List:   Active Problems:   Cancer, hepatocellular ([place])    Results:   Labs: Results for orders placed or performed during the hospital encounter of 06/19/2015 (from the past 48 hour(s))  Glucose, capillary     Status: Abnormal   Collection Time: 06/19/15 12:28 PM  Result Value Ref Range   Glucose-Capillary 125 (H) 65 - 99 mg/dL   Comment 1 Capillary Specimen    Comment 2 Notify RN   CBC     Status: Abnormal   Collection Time: 06/19/15  2:26 PM  Result Value Ref Range   WBC 5.3 4.0 - 10.5 K/uL   RBC 2.69 (L) 4.22 - 5.81 MIL/uL   Hemoglobin 8.0  (L) 13.0 - 17.0 g/dL   HCT 24.3 (L) 39.0 - 52.0 %   MCV 90.3 78.0 - 100.0 fL   MCH 29.7 26.0 - 34.0 pg   MCHC 32.9 30.0 - 36.0 g/dL   RDW 14.4 11.5 - 15.5 %   Platelets 77 (L) 150 - 400 K/uL    Comment: REPEATED TO VERIFY PLATELET COUNT CONFIRMED BY SMEAR   Protime-INR     Status: Abnormal   Collection Time: 06/19/15  2:26 PM  Result Value Ref Range   Prothrombin Time 21.7 (H) 11.6 - 15.2 seconds   INR 1.90 (H) 0.00 - 1.49  I-STAT 3, arterial blood gas (G3+)     Status: Abnormal   Collection Time: 06/19/15  2:53 PM  Result Value Ref Range   pH, Arterial 7.446 7.350 - 7.450   pCO2 arterial 44.2 35.0 - 45.0 mmHg   pO2, Arterial 73.0 (L) 80.0 - 100.0 mmHg   Bicarbonate 30.5 (H) 20.0 - 24.0 mEq/L   TCO2 32 0 - 100 mmol/L   O2 Saturation 95.0 %   Acid-Base Excess 6.0 (H) 0.0 - 2.0 mmol/L   Patient temperature 98.3 F    Collection site RADIAL, ALLEN'S TEST ACCEPTABLE    Drawn by Operator    Sample type ARTERIAL  Glucose, capillary     Status: Abnormal   Collection Time: 06/19/15  4:06 PM  Result Value Ref Range   Glucose-Capillary 148 (H) 65 - 99 mg/dL   Comment 1 Capillary Specimen    Comment 2 Notify RN   CBC     Status: Abnormal   Collection Time: 06/20/15  2:47 AM  Result Value Ref Range   WBC 5.9 4.0 - 10.5 K/uL   RBC 2.96 (L) 4.22 - 5.81 MIL/uL   Hemoglobin 8.7 (L) 13.0 - 17.0 g/dL   HCT 26.2 (L) 39.0 - 52.0 %   MCV 88.5 78.0 - 100.0 fL   MCH 29.4 26.0 - 34.0 pg   MCHC 33.2 30.0 - 36.0 g/dL   RDW 14.1 11.5 - 15.5 %   Platelets 101 (L) 150 - 400 K/uL    Comment: CONSISTENT WITH PREVIOUS RESULT  Comprehensive metabolic panel     Status: Abnormal   Collection Time: 06/20/15  2:47 AM  Result Value Ref Range   Sodium 134 (L) 135 - 145 mmol/L   Potassium 3.9 3.5 - 5.1 mmol/L   Chloride 100 (L) 101 - 111 mmol/L   CO2 27 22 - 32 mmol/L   Glucose, Bld 138 (H) 65 - 99 mg/dL   BUN 10 6 - 20 mg/dL   Creatinine, Ser 0.73 0.61 - 1.24 mg/dL   Calcium 8.6 (L) 8.9 - 10.3  mg/dL   Total Protein 5.0 (L) 6.5 - 8.1 g/dL   Albumin 3.6 3.5 - 5.0 g/dL   AST 75 (H) 15 - 41 U/L   ALT 81 (H) 17 - 63 U/L   Alkaline Phosphatase 34 (L) 38 - 126 U/L   Total Bilirubin 5.1 (H) 0.3 - 1.2 mg/dL   GFR calc non Af Amer >60 >60 mL/min   GFR calc Af Amer >60 >60 mL/min    Comment: (NOTE) The eGFR has been calculated using the CKD EPI equation. This calculation has not been validated in all clinical situations. eGFR's persistently <60 mL/min signify possible Chronic Kidney Disease.    Anion gap 7 5 - 15  Ammonia     Status: Abnormal   Collection Time: 06/20/15  9:10 AM  Result Value Ref Range   Ammonia 38 (H) 9 - 35 umol/L  Troponin I     Status: None   Collection Time: 06/20/15  9:10 AM  Result Value Ref Range   Troponin I 0.03 <0.031 ng/mL    Comment:        NO INDICATION OF MYOCARDIAL INJURY.   Protime-INR     Status: Abnormal   Collection Time: 06/20/15  9:10 AM  Result Value Ref Range   Prothrombin Time 21.6 (H) 11.6 - 15.2 seconds   INR 1.88 (H) 0.00 - 1.49  Culture, blood (Routine X 2) w Reflex to ID Panel     Status: None (Preliminary result)   Collection Time: 06/20/15 11:38 AM  Result Value Ref Range   Specimen Description BLOOD LEFT ANTECUBITAL    Special Requests BOTTLES DRAWN AEROBIC AND ANAEROBIC 10CC    Culture PENDING    Report Status PENDING   Urinalysis, Routine w reflex microscopic (not at [facility])     Status: Abnormal   Collection Time: 06/20/15 12:40 PM  Result Value Ref Range   Color, Urine YELLOW YELLOW   APPearance CLEAR CLEAR   Specific Gravity, Urine 1.003 (L) 1.005 - 1.030   pH 7.5 5.0 - 8.0   Glucose, UA NEGATIVE NEGATIVE mg/dL  Hgb urine dipstick LARGE (A) NEGATIVE   Bilirubin Urine NEGATIVE NEGATIVE   Ketones, ur NEGATIVE NEGATIVE mg/dL   Protein, ur NEGATIVE NEGATIVE mg/dL   Nitrite NEGATIVE NEGATIVE   Leukocytes, UA TRACE (A) NEGATIVE  Urine microscopic-add on     Status: Abnormal   Collection Time: 06/20/15 12:40 PM   Result Value Ref Range   Squamous Epithelial / LPF 0-5 (A) NONE SEEN   WBC, UA 0-5 0 - 5 WBC/hpf   RBC / HPF 0-5 0 - 5 RBC/hpf   Bacteria, UA RARE (A) NONE SEEN  CBC     Status: Abnormal   Collection Time: 06/21/15  3:30 AM  Result Value Ref Range   WBC 8.4 4.0 - 10.5 K/uL   RBC 3.19 (L) 4.22 - 5.81 MIL/uL   Hemoglobin 9.4 (L) 13.0 - 17.0 g/dL   HCT 28.1 (L) 39.0 - 52.0 %   MCV 88.1 78.0 - 100.0 fL   MCH 29.5 26.0 - 34.0 pg   MCHC 33.5 30.0 - 36.0 g/dL   RDW 14.3 11.5 - 15.5 %   Platelets 138 (L) 150 - 400 K/uL  Comprehensive metabolic panel     Status: Abnormal   Collection Time: 06/21/15  3:30 AM  Result Value Ref Range   Sodium 136 135 - 145 mmol/L   Potassium 4.1 3.5 - 5.1 mmol/L   Chloride 103 101 - 111 mmol/L   CO2 27 22 - 32 mmol/L   Glucose, Bld 129 (H) 65 - 99 mg/dL   BUN 11 6 - 20 mg/dL   Creatinine, Ser 0.76 0.61 - 1.24 mg/dL   Calcium 8.6 (L) 8.9 - 10.3 mg/dL   Total Protein 5.3 (L) 6.5 - 8.1 g/dL   Albumin 3.4 (L) 3.5 - 5.0 g/dL   AST 58 (H) 15 - 41 U/L   ALT 70 (H) 17 - 63 U/L   Alkaline Phosphatase 35 (L) 38 - 126 U/L   Total Bilirubin 4.9 (H) 0.3 - 1.2 mg/dL   GFR calc non Af Amer >60 >60 mL/min   GFR calc Af Amer >60 >60 mL/min    Comment: (NOTE) The eGFR has been calculated using the CKD EPI equation. This calculation has not been validated in all clinical situations. eGFR's persistently <60 mL/min signify possible Chronic Kidney Disease.    Anion gap 6 5 - 15  Glucose, capillary     Status: Abnormal   Collection Time: 06/21/15  7:11 AM  Result Value Ref Range   Glucose-Capillary 127 (H) 65 - 99 mg/dL   Comment 1 Capillary Specimen    Comment 2 Notify RN     Imaging / Studies: Dg Chest 2 View  06/20/2015  CLINICAL DATA:  60 year old male with shortness of breath since yesterday. Lethargy and confusion. History of nephrectomy on 06/07/2015. EXAM: CHEST  2 VIEW COMPARISON:  Chest x-ray 06/19/2015. FINDINGS: Lung volumes remain low. Moderate to  large right-sided pleural effusion with extensive atelectasis and/or consolidation the base of the right lung. Probable subsegmental atelectasis in the left lower lobe. No evidence of pulmonary edema. No pneumothorax. Heart size appears borderline enlarged. Upper mediastinal contours are distorted by patient's rotation to the right and low lung volumes. Surgical drain and surgical clips projecting over the right upper quadrant of the abdomen, presumably from recent nephrectomy. IMPRESSION: 1. Persistent low lung volumes with moderate to large right pleural effusion and atelectasis and/or airspace consolidation in the base of the right lung. 2. Subsegmental atelectasis in the left lower  lobe. Electronically Signed   By: Vinnie Langton M.D.   On: 06/20/2015 10:17   Ct Head Wo Contrast  06/20/2015  CLINICAL DATA:  Mental status change. Status post nephrectomy 4 days ago. EXAM: CT HEAD WITHOUT CONTRAST TECHNIQUE: Contiguous axial images were obtained from the base of the skull through the vertex without intravenous contrast. COMPARISON:  None. FINDINGS: There is no evidence of mass effect, midline shift or extra-axial fluid collections. There is no evidence of a space-occupying lesion or intracranial hemorrhage. There is no evidence of a cortical-based area of acute infarction. The ventricles and sulci are appropriate for the patient's age. The basal cisterns are patent. Visualized portions of the orbits are unremarkable. The visualized portions of the paranasal sinuses and mastoid air cells are unremarkable. The osseous structures are unremarkable. IMPRESSION: No acute intracranial pathology. Electronically Signed   By: Kathreen Devoid   On: 06/20/2015 11:06   Ct Abdomen Pelvis W Contrast  06/21/2015  CLINICAL DATA:  Status post partial nephrectomy and liver mass removal 4 days ago. Abdominal pain. EXAM: CT ABDOMEN AND PELVIS WITH CONTRAST TECHNIQUE: Multidetector CT imaging of the abdomen and pelvis was performed  using the standard protocol following bolus administration of intravenous contrast. CONTRAST:  131m ISOVUE-300 IOPAMIDOL (ISOVUE-300) INJECTION 61% COMPARISON:  Abdomen MR dated 05/15/2015 and abdomen ultrasound dated 05/13/2015. Chest radiographs dated 06/20/2015. FINDINGS: Lower chest: Moderate-sized right pleural effusion. Right lower lobe atelectasis. Small, loculated right lateral and medial pneumothorax occupying less than 5% of the volume of the right hemi thorax. This measures 3 mm in maximum thickness. Minimal dependent left lower lobe atelectasis. Hepatobiliary: Surgical absence of the right lobe of the liver hand majority of the medial segment of the left lobe. The remaining lateral segment left lobe and caudate lobe are enlarged. Associated surgical clips. A surgical drain is also in place. The gallbladder is also surgically absent. Pancreas: No mass, inflammatory changes, or other significant abnormality. Spleen: Within normal limits in size and appearance. Adrenals/Urinary Tract: Surgical absence of a portion of the upper pole of the right kidney, superiorly, at the location of the previously demonstrated 2.7 cm mass. The mass is no longer seen. Unremarkable left kidney, adrenal glands, ureters and urinary bladder. The bladder is not distended with a Foley catheter in place. There is a small amount of associated air in the bladder. Stomach/Bowel: No gastrointestinal abnormalities. Normal appearing appendix. Vascular/Lymphatic: Atheromatous arterial calcifications. No aneurysm or enlarged lymph nodes. Reproductive: Mildly enlarged prostate gland. Other: Minimal free peritoneal fluid. Known abnormal fluid collections. Small bilateral inguinal hernias containing fat. Small umbilical hernia containing fat. Musculoskeletal: Lower thoracic spine degenerative changes. Mild lumbar spine degenerative changes. IMPRESSION: 1. Less than 5% laterally and medially loculated right pneumothorax. 2. Moderate-sized  right pleural effusion. 3. Right lower lobe atelectasis. 4. Minimal left lower lobe atelectasis. 5. Status post partial hepatectomy without complicating features. 6. Status post partial upper pole right nephrectomy, without complicating features. 7. Minimal free peritoneal fluid. 8. Small umbilical and bilateral inguinal hernias. Electronically Signed   By: SClaudie ReveringM.D.   On: 06/21/2015 07:29    Medications / Allergies:  Scheduled Meds: . sodium chloride   Intravenous Once  . sodium chloride   Intravenous Once  . sodium chloride   Intravenous Once  . lactulose  10 g Oral TID  . LORazepam      . pantoprazole (PROTONIX) IV  40 mg Intravenous QHS  . senna  1 tablet Oral BID   Continuous Infusions: .  dextrose 5 % and 0.45 % NaCl with KCl 20 mEq/L 50 mL/hr at 06/21/15 0304  . lactated ringers Stopped (06/19/15 2300)  . phenylephrine (NEO-SYNEPHRINE) Adult infusion Stopped (06/17/15 1100)   PRN Meds:.acetaminophen **OR** acetaminophen, bisacodyl, fentaNYL (SUBLIMAZE) injection, hydrALAZINE, HYDROmorphone (DILAUDID) injection, LORazepam, methocarbamol, naLOXone (NARCAN)  injection, ondansetron **OR** ondansetron (ZOFRAN) IV, simethicone  Antibiotics: Anti-infectives    Start     Dose/Rate Route Frequency Ordered Stop   06/12/2015 2000  ceFAZolin (ANCEF) IVPB 2g/100 mL premix     2 g 200 mL/hr over 30 Minutes Intravenous Every 8 hours 06/10/2015 1911 06/19/2015 2009   05/31/2015 1030  ceFAZolin (ANCEF) IVPB 2g/100 mL premix     2 g 200 mL/hr over 30 Minutes Intravenous To [facility] 06/15/15 1425 05/30/2015 1452        Assessment/Plan HCC, right renal mass POD#5 exploratory laparotomy, right hepatectomy, right open partial nephrectomy--Dr. Barry Dienes, Dr. Louis Meckel -continue with clears and await bowel function -mobilize PT eval -continue JP drain -change dressing as needed -no malignancy on liver and gallbladder pathology, significant for cirrhosis. I discussed this with the wife.   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  Mental status changes-likely from cirrhosis.  Ammonia 38, continue with lactulose.  Treat pain with dilaudid. Cirrhosis/hep C-s/p treatment for HCV.  Heme-coagulopathy.  Anemia is stable. AM labs.  VTE prophylaxis-SCDs only FEN-IVF Dispo-continue ICU    Erby Pian, ANP-BC [facility]   06/21/2015 9:07 AM

## 2015-06-21 NOTE — Progress Notes (Signed)
Margarita Mail, NP to notify of patients HR in 140s-150s, BP [id number] systolic [id number] diastolic, NP to place orders for metoprolol.  Rowe Pavy, RN

## 2015-06-22 LAB — GLUCOSE, CAPILLARY
GLUCOSE-CAPILLARY: 130 mg/dL — AB (ref 65–99)
GLUCOSE-CAPILLARY: 113 mg/dL — AB (ref 65–99)
Glucose-Capillary: 144 mg/dL — ABNORMAL HIGH (ref 65–99)
Glucose-Capillary: 130 mg/dL — ABNORMAL HIGH (ref 65–99)

## 2015-06-22 LAB — CBC
HEMATOCRIT: 29 % — AB (ref 39.0–52.0)
HEMOGLOBIN: 9.6 g/dL — AB (ref 13.0–17.0)
MCH: 30.3 pg (ref 26.0–34.0)
MCHC: 33.1 g/dL (ref 30.0–36.0)
MCV: 91.5 fL (ref 78.0–100.0)
Platelets: 133 10*3/uL — ABNORMAL LOW (ref 150–400)
RBC: 3.17 MIL/uL — AB (ref 4.22–5.81)
RDW: 15.3 % (ref 11.5–15.5)
WBC: 9.9 10*3/uL (ref 4.0–10.5)

## 2015-06-22 LAB — AMMONIA: AMMONIA: 61 umol/L — AB (ref 9–35)

## 2015-06-22 LAB — COMPREHENSIVE METABOLIC PANEL
ALT: 59 U/L (ref 17–63)
ANION GAP: 4 — AB (ref 5–15)
AST: 51 U/L — AB (ref 15–41)
Albumin: 3.2 g/dL — ABNORMAL LOW (ref 3.5–5.0)
Alkaline Phosphatase: 42 U/L (ref 38–126)
BUN: 14 mg/dL (ref 6–20)
CHLORIDE: 108 mmol/L (ref 101–111)
CO2: 27 mmol/L (ref 22–32)
Calcium: 8.7 mg/dL — ABNORMAL LOW (ref 8.9–10.3)
Creatinine, Ser: 0.69 mg/dL (ref 0.61–1.24)
Glucose, Bld: 128 mg/dL — ABNORMAL HIGH (ref 65–99)
POTASSIUM: 4.1 mmol/L (ref 3.5–5.1)
Sodium: 139 mmol/L (ref 135–145)
Total Bilirubin: 4.5 mg/dL — ABNORMAL HIGH (ref 0.3–1.2)
Total Protein: 5.2 g/dL — ABNORMAL LOW (ref 6.5–8.1)

## 2015-06-22 LAB — PROTIME-INR
INR: 1.77 — AB (ref 0.00–1.49)
Prothrombin Time: 20.6 seconds — ABNORMAL HIGH (ref 11.6–15.2)

## 2015-06-22 MED ORDER — LACTULOSE 10 GM/15ML PO SOLN
30.0000 g | Freq: Three times a day (TID) | ORAL | Status: DC
  Administered 2015-06-22 – 2015-06-23 (×5): 30 g via ORAL
  Filled 2015-06-22 (×7): qty 45

## 2015-06-22 MED ORDER — VITAL HIGH PROTEIN PO LIQD
1000.0000 mL | ORAL | Status: DC
Start: 2015-06-22 — End: 2015-06-23
  Administered 2015-06-22: 1000 mL

## 2015-06-22 NOTE — Progress Notes (Signed)
Patient ID: Joe Allen, male   DOB: 1955/04/15, 60 y.o.   MRN: [id number]     [facility]      [address] [place] [place], [place] [address]    Phone: [phone] FAX: [phone]     Subjective: Labs pending.  bp better.  Having BMs. With lactulose enemas.  Still confused.   Objective:  Vital signs:  Filed Vitals:   06/22/15 0400 06/22/15 0500 06/22/15 0600 06/22/15 0700  BP: 146/82 152/84 135/95 135/99  Pulse: 81 80 72 78  Temp: 97.8 F (36.6 C)   97.8 F (36.6 C)  TempSrc: Oral   Oral  Resp: 8 9 8 8   Height:      Weight:  98.6 kg (217 lb 6 oz)    SpO2: 100% 100% 100% 99%    Last BM Date: 06/21/15  Intake/Output   Yesterday:  05/28 0701 - 05/29 0700 In: 1200 [I.V.:1200] Out: 1680 [Urine:1420; Drains:260] This shift: I/O last 3 completed shifts: In: 1800 [I.V.:1800] Out: 2445 [Urine:1985; Drains:460]   Physical Exam: General: Pt drowsy.  Opens eyes to command.  Verbalizes at times. No acute distress.  Eyes: PERRL, normal EOM. Sclera clear. No icterus Neuro: CN II-XII intact w/o focal sensory/motor deficits. Chest: diminished RLL CV: Pulses intact. Regular rhythm Abdomen: Soft. non distended.. Incision with serous drainagee.  jp drain with serosanguinous output. No evidence of peritonitis. No incarcerated hernias. Ext: +1 pitting edema.  Skin: No petechiae / purpura     Problem List:   Active Problems:   Cancer, hepatocellular ([place])    Results:   Labs: Results for orders placed or performed during the hospital encounter of 05/26/2015 (from the past 48 hour(s))  Ammonia     Status: Abnormal   Collection Time: 06/20/15  9:10 AM  Result Value Ref Range   Ammonia 38 (H) 9 - 35 umol/L  Troponin I     Status: None   Collection Time: 06/20/15  9:10 AM  Result Value Ref Range   Troponin I 0.03 <0.031 ng/mL    Comment:        NO INDICATION OF MYOCARDIAL INJURY.   Protime-INR     Status:  Abnormal   Collection Time: 06/20/15  9:10 AM  Result Value Ref Range   Prothrombin Time 21.6 (H) 11.6 - 15.2 seconds   INR 1.88 (H) 0.00 - 1.49  Culture, blood (Routine X 2) w Reflex to ID Panel     Status: None (Preliminary result)   Collection Time: 06/20/15 11:38 AM  Result Value Ref Range   Specimen Description BLOOD LEFT ANTECUBITAL    Special Requests BOTTLES DRAWN AEROBIC AND ANAEROBIC 10CC    Culture NO GROWTH 1 DAY    Report Status PENDING   Culture, blood (Routine X 2) w Reflex to ID Panel     Status: None (Preliminary result)   Collection Time: 06/20/15 11:46 AM  Result Value Ref Range   Specimen Description BLOOD LEFT HAND    Special Requests BOTTLES DRAWN AEROBIC AND ANAEROBIC 10CC    Culture NO GROWTH 1 DAY    Report Status PENDING   Urinalysis, Routine w reflex microscopic (not at [facility])     Status: Abnormal   Collection Time: 06/20/15 12:40 PM  Result Value Ref Range   Color, Urine YELLOW YELLOW   APPearance CLEAR CLEAR   Specific Gravity, Urine 1.003 (L) 1.005 - 1.030   pH 7.5 5.0 - 8.0   Glucose, UA NEGATIVE  NEGATIVE mg/dL   Hgb urine dipstick LARGE (A) NEGATIVE   Bilirubin Urine NEGATIVE NEGATIVE   Ketones, ur NEGATIVE NEGATIVE mg/dL   Protein, ur NEGATIVE NEGATIVE mg/dL   Nitrite NEGATIVE NEGATIVE   Leukocytes, UA TRACE (A) NEGATIVE  Culture, Urine     Status: None   Collection Time: 06/20/15 12:40 PM  Result Value Ref Range   Specimen Description URINE, CATHETERIZED    Special Requests Normal    Culture NO GROWTH    Report Status 06/21/2015 FINAL   Urine microscopic-add on     Status: Abnormal   Collection Time: 06/20/15 12:40 PM  Result Value Ref Range   Squamous Epithelial / LPF 0-5 (A) NONE SEEN   WBC, UA 0-5 0 - 5 WBC/hpf   RBC / HPF 0-5 0 - 5 RBC/hpf   Bacteria, UA RARE (A) NONE SEEN  CBC     Status: Abnormal   Collection Time: 06/21/15  3:30 AM  Result Value Ref Range   WBC 8.4 4.0 - 10.5 K/uL   RBC 3.19 (L) 4.22 - 5.81 MIL/uL    Hemoglobin 9.4 (L) 13.0 - 17.0 g/dL   HCT 28.1 (L) 39.0 - 52.0 %   MCV 88.1 78.0 - 100.0 fL   MCH 29.5 26.0 - 34.0 pg   MCHC 33.5 30.0 - 36.0 g/dL   RDW 14.3 11.5 - 15.5 %   Platelets 138 (L) 150 - 400 K/uL  Comprehensive metabolic panel     Status: Abnormal   Collection Time: 06/21/15  3:30 AM  Result Value Ref Range   Sodium 136 135 - 145 mmol/L   Potassium 4.1 3.5 - 5.1 mmol/L   Chloride 103 101 - 111 mmol/L   CO2 27 22 - 32 mmol/L   Glucose, Bld 129 (H) 65 - 99 mg/dL   BUN 11 6 - 20 mg/dL   Creatinine, Ser 0.76 0.61 - 1.24 mg/dL   Calcium 8.6 (L) 8.9 - 10.3 mg/dL   Total Protein 5.3 (L) 6.5 - 8.1 g/dL   Albumin 3.4 (L) 3.5 - 5.0 g/dL   AST 58 (H) 15 - 41 U/L   ALT 70 (H) 17 - 63 U/L   Alkaline Phosphatase 35 (L) 38 - 126 U/L   Total Bilirubin 4.9 (H) 0.3 - 1.2 mg/dL   GFR calc non Af Amer >60 >60 mL/min   GFR calc Af Amer >60 >60 mL/min    Comment: (NOTE) The eGFR has been calculated using the CKD EPI equation. This calculation has not been validated in all clinical situations. eGFR's persistently <60 mL/min signify possible Chronic Kidney Disease.    Anion gap 6 5 - 15  Glucose, capillary     Status: Abnormal   Collection Time: 06/21/15  7:11 AM  Result Value Ref Range   Glucose-Capillary 127 (H) 65 - 99 mg/dL   Comment 1 Capillary Specimen    Comment 2 Notify RN   Urology - JP creatinine     Status: None   Collection Time: 06/21/15  1:25 PM  Result Value Ref Range   Creat, Fluid 0.7 mg/dL    Comment: (NOTE) No normal range established for this test Results should be evaluated in conjunction with serum values    Fluid Type-FCRE JP DRAINAGE     Imaging / Studies: Dg Chest 2 View  06/20/2015  CLINICAL DATA:  60 year old male with shortness of breath since yesterday. Lethargy and confusion. History of nephrectomy on 06/24/2015. EXAM: CHEST  2 VIEW COMPARISON:  Chest  x-ray 06/19/2015. FINDINGS: Lung volumes remain low. Moderate to large right-sided pleural  effusion with extensive atelectasis and/or consolidation the base of the right lung. Probable subsegmental atelectasis in the left lower lobe. No evidence of pulmonary edema. No pneumothorax. Heart size appears borderline enlarged. Upper mediastinal contours are distorted by patient's rotation to the right and low lung volumes. Surgical drain and surgical clips projecting over the right upper quadrant of the abdomen, presumably from recent nephrectomy. IMPRESSION: 1. Persistent low lung volumes with moderate to large right pleural effusion and atelectasis and/or airspace consolidation in the base of the right lung. 2. Subsegmental atelectasis in the left lower lobe. Electronically Signed   By: Vinnie Langton M.D.   On: 06/20/2015 10:17   Ct Head Wo Contrast  06/20/2015  CLINICAL DATA:  Mental status change. Status post nephrectomy 4 days ago. EXAM: CT HEAD WITHOUT CONTRAST TECHNIQUE: Contiguous axial images were obtained from the base of the skull through the vertex without intravenous contrast. COMPARISON:  None. FINDINGS: There is no evidence of mass effect, midline shift or extra-axial fluid collections. There is no evidence of a space-occupying lesion or intracranial hemorrhage. There is no evidence of a cortical-based area of acute infarction. The ventricles and sulci are appropriate for the patient's age. The basal cisterns are patent. Visualized portions of the orbits are unremarkable. The visualized portions of the paranasal sinuses and mastoid air cells are unremarkable. The osseous structures are unremarkable. IMPRESSION: No acute intracranial pathology. Electronically Signed   By: Kathreen Devoid   On: 06/20/2015 11:06   Ct Abdomen Pelvis W Contrast  06/21/2015  CLINICAL DATA:  Status post partial nephrectomy and liver mass removal 4 days ago. Abdominal pain. EXAM: CT ABDOMEN AND PELVIS WITH CONTRAST TECHNIQUE: Multidetector CT imaging of the abdomen and pelvis was performed using the standard  protocol following bolus administration of intravenous contrast. CONTRAST:  142m ISOVUE-300 IOPAMIDOL (ISOVUE-300) INJECTION 61% COMPARISON:  Abdomen MR dated 05/15/2015 and abdomen ultrasound dated 05/13/2015. Chest radiographs dated 06/20/2015. FINDINGS: Lower chest: Moderate-sized right pleural effusion. Right lower lobe atelectasis. Small, loculated right lateral and medial pneumothorax occupying less than 5% of the volume of the right hemi thorax. This measures 3 mm in maximum thickness. Minimal dependent left lower lobe atelectasis. Hepatobiliary: Surgical absence of the right lobe of the liver hand majority of the medial segment of the left lobe. The remaining lateral segment left lobe and caudate lobe are enlarged. Associated surgical clips. A surgical drain is also in place. The gallbladder is also surgically absent. Pancreas: No mass, inflammatory changes, or other significant abnormality. Spleen: Within normal limits in size and appearance. Adrenals/Urinary Tract: Surgical absence of a portion of the upper pole of the right kidney, superiorly, at the location of the previously demonstrated 2.7 cm mass. The mass is no longer seen. Unremarkable left kidney, adrenal glands, ureters and urinary bladder. The bladder is not distended with a Foley catheter in place. There is a small amount of associated air in the bladder. Stomach/Bowel: No gastrointestinal abnormalities. Normal appearing appendix. Vascular/Lymphatic: Atheromatous arterial calcifications. No aneurysm or enlarged lymph nodes. Reproductive: Mildly enlarged prostate gland. Other: Minimal free peritoneal fluid. Known abnormal fluid collections. Small bilateral inguinal hernias containing fat. Small umbilical hernia containing fat. Musculoskeletal: Lower thoracic spine degenerative changes. Mild lumbar spine degenerative changes. IMPRESSION: 1. Less than 5% laterally and medially loculated right pneumothorax. 2. Moderate-sized right pleural  effusion. 3. Right lower lobe atelectasis. 4. Minimal left lower lobe atelectasis. 5. Status post partial hepatectomy  without complicating features. 6. Status post partial upper pole right nephrectomy, without complicating features. 7. Minimal free peritoneal fluid. 8. Small umbilical and bilateral inguinal hernias. Electronically Signed   By: Claudie Revering M.D.   On: 06/21/2015 07:29    Medications / Allergies:  Scheduled Meds: . sodium chloride   Intravenous Once  . sodium chloride   Intravenous Once  . sodium chloride   Intravenous Once  . lactulose  300 mL Rectal BID  . metoprolol  5 mg Intravenous Q6H  . pantoprazole (PROTONIX) IV  40 mg Intravenous QHS  . senna  1 tablet Oral BID   Continuous Infusions: . dextrose 5 % and 0.45 % NaCl with KCl 20 mEq/L 50 mL/hr at 06/21/15 1700  . lactated ringers Stopped (06/19/15 2300)  . phenylephrine (NEO-SYNEPHRINE) Adult infusion Stopped (06/17/15 1100)   PRN Meds:.acetaminophen **OR** acetaminophen, bisacodyl, fentaNYL (SUBLIMAZE) injection, hydrALAZINE, HYDROmorphone (DILAUDID) injection, LORazepam, methocarbamol, naLOXone (NARCAN)  injection, ondansetron **OR** ondansetron (ZOFRAN) IV, simethicone  Antibiotics: Anti-infectives    Start     Dose/Rate Route Frequency Ordered Stop   06/19/2015 2000  ceFAZolin (ANCEF) IVPB 2g/100 mL premix     2 g 200 mL/hr over 30 Minutes Intravenous Every 8 hours 05/31/2015 1911 06/09/2015 2009   06/05/2015 1030  ceFAZolin (ANCEF) IVPB 2g/100 mL premix     2 g 200 mL/hr over 30 Minutes Intravenous To [facility] 06/15/15 1425 05/30/2015 1452       Assessment/Plan HCC, right renal mass POD#6 exploratory laparotomy, right hepatectomy, right open partial nephrectomy--Dr. Barry Dienes, Dr. Louis Meckel -place NGT and start trickle TF -mobilize PT eval -continue JP drain -change dressing as needed -no malignancy on liver and gallbladder pathology, significant for cirrhosis. I discussed this with the wife. 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  Hepatic encephalopathy-continue lactulose, follow labs Cirrhosis/hep C-s/p treatment for HCV.  HTN-lopressor Heme-coagulopathy.  Anemia.  Labs are pending VTE prophylaxis-SCDs only FEN-IVF.  Start trickle TF Dispo-continue ICU   Erby Pian, ANP-BC [facility]   06/22/2015 8:53 AM

## 2015-06-22 NOTE — Progress Notes (Signed)
Physical Therapy Treatment Patient Details Name: Kaylen Casamento MRN: [id number] DOB: 1955/04/24 Today's Date: 06/22/2015    History of Present Illness  60 yo male admitted 06/03/2015 forexploratory laparotomy, right hepatectomy, right open partial nephrectomy for hepatic cancer    PT Comments    Pt admitted with above diagnosis. Pt currently with functional limitations due to the deficits listed below (see PT Problem List). Pt was able to sit EOB for up to 8 minutes with min to min guard assist.  Pt not following any commands however and not responsive to questions.  Very flat affect.  Did ask for water but PT explained to pt he could not have it and he cursed. Left pt in bed with bed in 60 degree chair position.  Pt will benefit from skilled PT to increase their independence and safety with mobility to allow discharge to the venue listed below.    Follow Up Recommendations  SNF;Supervision/Assistance - 24 hour     Equipment Recommendations  None recommended by PT    Recommendations for Other Services       Precautions / Restrictions Precautions Precautions: Fall Precaution Comments: R abd. drain Restrictions Weight Bearing Restrictions: No    Mobility  Bed Mobility Overal bed mobility: Needs Assistance;+2 for physical assistance;+ 2 for safety/equipment Bed Mobility: Supine to Sit     Supine to sit: Max assist;+2 for physical assistance;+2 for safety/equipment     General bed mobility comments: tactile and multimodal cues to move legs to edge and assist to sit upright, patient very flat.  Transfers                    Ambulation/Gait                 Stairs            Wheelchair Mobility    Modified Rankin (Stroke Patients Only)       Balance Overall balance assessment: Needs assistance Sitting-balance support: Feet supported;No upper extremity supported Sitting balance-Leahy Scale: Fair Sitting balance - Comments: was able to sit EOB with  no physical assist for up to 8 min.  would not follow commands to move any extremities even with sitting EOB.                             Cognition Arousal/Alertness: Lethargic Behavior During Therapy: Flat affect Overall Cognitive Status: Impaired/Different from baseline Area of Impairment: Following commands;Awareness       Following Commands: Follows one step commands inconsistently;Follows one step commands with increased time       General Comments: Does not follow commands, required tactile cues for activity, no eye contact, very blank face.    Exercises      General Comments        Pertinent Vitals/Pain Pain Assessment: Faces Faces Pain Scale: Hurts little more Pain Location: abdomen Pain Descriptors / Indicators: Discomfort;Grimacing;Guarding Pain Intervention(s): Limited activity within patient's tolerance;Monitored during session;Repositioned  VSS    Home Living                      Prior Function            PT Goals (current goals can now be found in the care plan section) Progress towards PT goals: Progressing toward goals    Frequency  Min 3X/week    PT Plan Current plan remains appropriate    Co-evaluation  End of Session Equipment Utilized During Treatment: Gait belt;Oxygen Activity Tolerance: Patient limited by lethargy Patient left: with call bell/phone within reach;in bed;with restraints reapplied     Time: [id number] PT Time Calculation (min) (ACUTE ONLY): 24 min  Charges:  $Therapeutic Activity: 23-37 mins                    G CodesDenice Paradise July 21, 2015, 3:48 PM  [facility] [phone] (pager)

## 2015-06-23 LAB — CBC
HCT: 27.7 % — ABNORMAL LOW (ref 39.0–52.0)
Hemoglobin: 9.2 g/dL — ABNORMAL LOW (ref 13.0–17.0)
MCH: 30.2 pg (ref 26.0–34.0)
MCHC: 33.2 g/dL (ref 30.0–36.0)
MCV: 90.8 fL (ref 78.0–100.0)
PLATELETS: 119 10*3/uL — AB (ref 150–400)
RBC: 3.05 MIL/uL — AB (ref 4.22–5.81)
RDW: 15.5 % (ref 11.5–15.5)
WBC: 8.6 10*3/uL (ref 4.0–10.5)

## 2015-06-23 LAB — COMPREHENSIVE METABOLIC PANEL
ALBUMIN: 3.1 g/dL — AB (ref 3.5–5.0)
ALT: 56 U/L (ref 17–63)
AST: 53 U/L — AB (ref 15–41)
Alkaline Phosphatase: 51 U/L (ref 38–126)
Anion gap: 5 (ref 5–15)
BUN: 14 mg/dL (ref 6–20)
CHLORIDE: 108 mmol/L (ref 101–111)
CO2: 26 mmol/L (ref 22–32)
CREATININE: 0.66 mg/dL (ref 0.61–1.24)
Calcium: 8.7 mg/dL — ABNORMAL LOW (ref 8.9–10.3)
GFR calc non Af Amer: >60 mL/min (ref >60)
GLUCOSE: 131 mg/dL — AB (ref 65–99)
Potassium: 3.5 mmol/L (ref 3.5–5.1)
SODIUM: 139 mmol/L (ref 135–145)
Total Bilirubin: 4.4 mg/dL — ABNORMAL HIGH (ref 0.3–1.2)
Total Protein: 5.2 g/dL — ABNORMAL LOW (ref 6.5–8.1)

## 2015-06-23 LAB — GLUCOSE, CAPILLARY
GLUCOSE-CAPILLARY: 139 mg/dL — AB (ref 65–99)
GLUCOSE-CAPILLARY: 142 mg/dL — AB (ref 65–99)
GLUCOSE-CAPILLARY: 144 mg/dL — AB (ref 65–99)
Glucose-Capillary: 122 mg/dL — ABNORMAL HIGH (ref 65–99)
Glucose-Capillary: 129 mg/dL — ABNORMAL HIGH (ref 65–99)

## 2015-06-23 LAB — PROTIME-INR
INR: 1.84 — ABNORMAL HIGH (ref 0.00–1.49)
PROTHROMBIN TIME: 21.2 s — AB (ref 11.6–15.2)

## 2015-06-23 MED ORDER — VITAL HIGH PROTEIN PO LIQD
1000.0000 mL | ORAL | Status: DC
  Administered 2015-06-23: 1000 mL

## 2015-06-23 MED ORDER — VITAL AF 1.2 CAL PO LIQD
1000.0000 mL | ORAL | Status: DC
  Administered 2015-06-23 – 2015-06-24 (×2): 1000 mL
  Filled 2015-06-23 (×4): qty 1000

## 2015-06-23 MED ORDER — LACTULOSE 10 GM/15ML PO SOLN
30.0000 g | Freq: Four times a day (QID) | ORAL | Status: DC
  Administered 2015-06-23 – 2015-06-24 (×5): 30 g via ORAL
  Filled 2015-06-23 (×9): qty 45

## 2015-06-23 NOTE — Care Management Note (Signed)
Case Management Note  Patient Details  Name: Joe Allen MRN: [id number] Date of Birth: 1955-08-26  Subjective/Objective:    Pt admitted with RIGHT HEPATECTOMY (Right) RIGHT OPEN PARTIAL NEPHRECTOMY (N/A) as a surgical intervention                 Action/Plan:  PTA - pt was independent from home with wife.  SNF recommended - CSW consulted.  CM will continue to monitor for disposition need   Expected Discharge Date:                  Expected Discharge Plan:  Home/Self Care (Independent from home with wife)  In-House Referral:     Discharge planning Services  CM Consult  Post Acute Care Choice:    Choice offered to:     DME Arranged:    DME Agency:     HH Arranged:    [place] Agency:     Status of Service:  In process, will continue to follow  Medicare Important Message Given:    Date Medicare IM Given:    Medicare IM give by:    Date Additional Medicare IM Given:    Additional Medicare Important Message give by:     If discussed at [place] of Stay Meetings, dates discussed:    Additional Comments:  Maryclare Labrador, RN 06/23/2015, 8:54 AM

## 2015-06-23 NOTE — Progress Notes (Signed)
Patient ID: Joe Allen, male   DOB: [birth year], 60 y.o.   MRN: [id number]     [facility]      [address] [place] [place], [place] [address]    Phone: [phone] FAX: [phone]     Subjective: BP improved.  More alert with less narcotic.    Objective:  Vital signs:  Filed Vitals:   06/23/15 0500 06/23/15 0535 06/23/15 0600 06/23/15 0700  BP: 121/86  120/63 130/102  Pulse: 87 87 73 77  Temp:    97.8 F (36.6 C)  TempSrc:    Oral  Resp: 13 14 10 16   Height:      Weight:      SpO2: 98% 99% 100% 100%    Last BM Date: 06/22/15  Intake/Output   Yesterday:  05/29 0701 - 05/30 0700 In: 1608 [I.V.:1250; NG/GT:358] Out: 1770 [Urine:1490; Drains:280] This shift: I/O last 3 completed shifts: In: 2208 [I.V.:1850; NG/GT:358] Out: 2620 [Urine:2200; Drains:420]   Physical Exam: General: awakens easily.  Not complaining of pain.   Eyes: PERRL, normal EOM. Sclera clear. mild icterus Neuro: CN II-XII intact w/o focal sensory/motor deficits. Chest: breathing comfortably CV: Pulses intact. Regular rhythm Abdomen: Soft. non distended. Dressing c/d/i.  Drain serosang Ext: +1 pitting edema.  Skin: No petechiae / purpura     Problem List:   Active Problems:   Cancer, hepatocellular ([place])    Results:   Labs: Results for orders placed or performed during the hospital encounter of 06/10/2015 (from the past 48 hour(s))  Urology - JP creatinine     Status: None   Collection Time: 06/21/15  1:25 PM  Result Value Ref Range   Creat, Fluid 0.7 mg/dL    Comment: (NOTE) No normal range established for this test Results should be evaluated in conjunction with serum values    Fluid Type-FCRE JP DRAINAGE   Comprehensive metabolic panel     Status: Abnormal   Collection Time: 06/22/15  8:52 AM  Result Value Ref Range   Sodium 139 135 - 145 mmol/L   Potassium 4.1 3.5 - 5.1 mmol/L   Chloride 108 101 - 111 mmol/L   CO2 27 22  - 32 mmol/L   Glucose, Bld 128 (H) 65 - 99 mg/dL   BUN 14 6 - 20 mg/dL   Creatinine, Ser 0.69 0.61 - 1.24 mg/dL   Calcium 8.7 (L) 8.9 - 10.3 mg/dL   Total Protein 5.2 (L) 6.5 - 8.1 g/dL   Albumin 3.2 (L) 3.5 - 5.0 g/dL   AST 51 (H) 15 - 41 U/L   ALT 59 17 - 63 U/L   Alkaline Phosphatase 42 38 - 126 U/L   Total Bilirubin 4.5 (H) 0.3 - 1.2 mg/dL   GFR calc non Af Amer >60 >60 mL/min   GFR calc Af Amer >60 >60 mL/min    Comment: (NOTE) The eGFR has been calculated using the CKD EPI equation. This calculation has not been validated in all clinical situations. eGFR's persistently <60 mL/min signify possible Chronic Kidney Disease.    Anion gap 4 (L) 5 - 15  CBC     Status: Abnormal   Collection Time: 06/22/15  8:52 AM  Result Value Ref Range   WBC 9.9 4.0 - 10.5 K/uL   RBC 3.17 (L) 4.22 - 5.81 MIL/uL   Hemoglobin 9.6 (L) 13.0 - 17.0 g/dL   HCT 29.0 (L) 39.0 - 52.0 %   MCV 91.5 78.0 - 100.0 fL  MCH 30.3 26.0 - 34.0 pg   MCHC 33.1 30.0 - 36.0 g/dL   RDW 15.3 11.5 - 15.5 %   Platelets 133 (L) 150 - 400 K/uL  Protime-INR     Status: Abnormal   Collection Time: 06/22/15  8:52 AM  Result Value Ref Range   Prothrombin Time 20.6 (H) 11.6 - 15.2 seconds   INR 1.77 (H) 0.00 - 1.49  Ammonia     Status: Abnormal   Collection Time: 06/22/15  8:52 AM  Result Value Ref Range   Ammonia 61 (H) 9 - 35 umol/L  Glucose, capillary     Status: Abnormal   Collection Time: 06/22/15 12:50 PM  Result Value Ref Range   Glucose-Capillary 130 (H) 65 - 99 mg/dL   Comment 1 Notify RN   Glucose, capillary     Status: Abnormal   Collection Time: 06/22/15  3:19 PM  Result Value Ref Range   Glucose-Capillary 113 (H) 65 - 99 mg/dL   Comment 1 Notify RN   Glucose, capillary     Status: Abnormal   Collection Time: 06/22/15  7:38 PM  Result Value Ref Range   Glucose-Capillary 144 (H) 65 - 99 mg/dL   Comment 1 Notify RN    Comment 2 Document in Chart   Glucose, capillary     Status: Abnormal    Collection Time: 06/22/15 11:25 PM  Result Value Ref Range   Glucose-Capillary 130 (H) 65 - 99 mg/dL   Comment 1 Notify RN    Comment 2 Document in Chart   Glucose, capillary     Status: Abnormal   Collection Time: 06/23/15  3:15 AM  Result Value Ref Range   Glucose-Capillary 122 (H) 65 - 99 mg/dL   Comment 1 Notify RN    Comment 2 Document in Chart   CBC     Status: Abnormal   Collection Time: 06/23/15  4:39 AM  Result Value Ref Range   WBC 8.6 4.0 - 10.5 K/uL   RBC 3.05 (L) 4.22 - 5.81 MIL/uL   Hemoglobin 9.2 (L) 13.0 - 17.0 g/dL   HCT 27.7 (L) 39.0 - 52.0 %   MCV 90.8 78.0 - 100.0 fL   MCH 30.2 26.0 - 34.0 pg   MCHC 33.2 30.0 - 36.0 g/dL   RDW 15.5 11.5 - 15.5 %   Platelets 119 (L) 150 - 400 K/uL    Comment: CONSISTENT WITH PREVIOUS RESULT  Comprehensive metabolic panel     Status: Abnormal   Collection Time: 06/23/15  4:39 AM  Result Value Ref Range   Sodium 139 135 - 145 mmol/L   Potassium 3.5 3.5 - 5.1 mmol/L   Chloride 108 101 - 111 mmol/L   CO2 26 22 - 32 mmol/L   Glucose, Bld 131 (H) 65 - 99 mg/dL   BUN 14 6 - 20 mg/dL   Creatinine, Ser 0.66 0.61 - 1.24 mg/dL   Calcium 8.7 (L) 8.9 - 10.3 mg/dL   Total Protein 5.2 (L) 6.5 - 8.1 g/dL   Albumin 3.1 (L) 3.5 - 5.0 g/dL   AST 53 (H) 15 - 41 U/L   ALT 56 17 - 63 U/L   Alkaline Phosphatase 51 38 - 126 U/L   Total Bilirubin 4.4 (H) 0.3 - 1.2 mg/dL   GFR calc non Af Amer >60 >60 mL/min   GFR calc Af Amer >60 >60 mL/min    Comment: (NOTE) The eGFR has been calculated using the CKD EPI equation. This calculation  has not been validated in all clinical situations. eGFR's persistently <60 mL/min signify possible Chronic Kidney Disease.    Anion gap 5 5 - 15  Protime-INR     Status: Abnormal   Collection Time: 06/23/15  4:39 AM  Result Value Ref Range   Prothrombin Time 21.2 (H) 11.6 - 15.2 seconds   INR 1.84 (H) 0.00 - 1.49  Glucose, capillary     Status: Abnormal   Collection Time: 06/23/15  7:21 AM  Result Value  Ref Range   Glucose-Capillary 139 (H) 65 - 99 mg/dL   Comment 1 Notify RN     Imaging / Studies: No results found.  Medications / Allergies:  Scheduled Meds: . sodium chloride   Intravenous Once  . sodium chloride   Intravenous Once  . sodium chloride   Intravenous Once  . feeding supplement (VITAL HIGH PROTEIN)  1,000 mL Per Tube Q24H  . lactulose  30 g Oral TID  . metoprolol  5 mg Intravenous Q6H  . pantoprazole (PROTONIX) IV  40 mg Intravenous QHS  . senna  1 tablet Oral BID   Continuous Infusions: . dextrose 5 % and 0.45 % NaCl with KCl 20 mEq/L 50 mL/hr at 06/23/15 0700  . lactated ringers Stopped (06/19/15 2300)  . phenylephrine (NEO-SYNEPHRINE) Adult infusion Stopped (06/17/15 1100)   PRN Meds:.acetaminophen **OR** acetaminophen, bisacodyl, fentaNYL (SUBLIMAZE) injection, hydrALAZINE, HYDROmorphone (DILAUDID) injection, LORazepam, methocarbamol, naLOXone (NARCAN)  injection, ondansetron **OR** ondansetron (ZOFRAN) IV, simethicone  Antibiotics: Anti-infectives    Start     Dose/Rate Route Frequency Ordered Stop   06/23/2015 2000  ceFAZolin (ANCEF) IVPB 2g/100 mL premix     2 g 200 mL/hr over 30 Minutes Intravenous Every 8 hours 06/24/2015 1911 06/02/2015 2009   06/21/2015 1030  ceFAZolin (ANCEF) IVPB 2g/100 mL premix     2 g 200 mL/hr over 30 Minutes Intravenous To ShortStay Surgical 06/15/15 1425 06/12/2015 1452       Assessment/Plan HCC, right renal mass POD#7 exploratory laparotomy, right hepatectomy, right open partial nephrectomy--Dr. Barry Dienes, Dr. Louis Meckel -advance tube feeds.  Add sips of clears.   -mobilize PT eval -continue JP drain -change dressing as needed -no malignancy on liver and gallbladder pathology, significant for cirrhosis. I discussed this with the wife. 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  Hepatic encephalopathy-continue lactulose, follow labs Cirrhosis/hep C-s/p treatment for HCV.   HTN-lopressor Heme-coagulopathy.  Acute blood loss anemia. stable VTE prophylaxis-SCDs only FEN-IVF.  Tube feeds until improved mental status.   [facility]   06/23/2015 8:41 AM

## 2015-06-23 NOTE — Progress Notes (Signed)
Physical Therapy Treatment Patient Details Name: Joe Allen MRN: [id number] DOB: Apr 03, 1955 Today's Date: 06/23/2015    History of Present Illness  60 yo male admitted 06/15/2015 forexploratory laparotomy, right hepatectomy, right open partial nephrectomy for hepatic cancer    PT Comments    The patient  Is more awake today and following a few commands with delay. He was stronger in standing and  Pivoting to the  Recliner with  A RW and then to a BSC.  Patient had  Large loose BM during mobility. The patient will benefit from  continued Therapy while in acute care. Please order OT consult.   Follow Up Recommendations  SNF;Supervision/Assistance - 24 hour     Equipment Recommendations  None recommended by PT    Recommendations for Other Services       Precautions / Restrictions Precautions Precautions: Fall Precaution Comments: loose BM, incontinent. R drain, NG tube feeding    Mobility  Bed Mobility Overal bed mobility: Needs Assistance;+2 for physical assistance;+ 2 for safety/equipment Bed Mobility: Supine to Sit     Supine to sit: +2 for physical assistance;+2 for safety/equipment;Mod assist     General bed mobility comments: multimodal cues for moving legs toedge, assist with trunk to sit upright. delayed in following commands,  Transfers Overall transfer level: Needs assistance Equipment used: Rolling walker (2 wheeled) Transfers: Sit to/from [facility] Sit to Stand: Mod assist;+2 safety/equipment Stand pivot transfers: +2 safety/equipment;Mod assist       General transfer comment: multimodal cues for safety. the patient istood up and held his weight, able to take a few pivotal steps to the  recloiner, onset of loose BM so BSC brought up behind hime. Tactile cues to reach for armrests. stood again  and recliner brought up after pericare provided.  HR 122, sats >99 on RA.  Ambulation/Gait                 Stairs             Wheelchair Mobility    Modified Rankin (Stroke Patients Only)       Balance                                    Cognition Arousal/Alertness: Lethargic Behavior During Therapy: Flat affect Overall Cognitive Status: Impaired/Different from baseline Area of Impairment: Following commands;Safety/judgement;Awareness       Following Commands: Follows one step commands inconsistently       General Comments: improved in arousal, and following simple commands. oriented to " [place]"    Exercises      General Comments        Pertinent Vitals/Pain Faces Pain Scale: Hurts little more Pain Location: abdomen Pain Descriptors / Indicators: Grimacing;Moaning Pain Intervention(s): Monitored during session    Home Living                      Prior Function            PT Goals (current goals can now be found in the care plan section) Progress towards PT goals: Progressing toward goals    Frequency  Min 3X/week    PT Plan Current plan remains appropriate    Co-evaluation             End of Session Equipment Utilized During Treatment: Gait belt Activity Tolerance: Patient limited by lethargy;Patient limited by fatigue Patient left: with  call bell/phone within reach;in chair;with chair alarm set     Time: 1115-1145 PT Time Calculation (min) (ACUTE ONLY): 30 min  Charges:  $Therapeutic Activity: 23-37 mins                    G Codes:      Claretha Cooper 06/23/2015, 12:57 PM Tresa Endo PT [phone]

## 2015-06-23 NOTE — Progress Notes (Signed)
Initial Nutrition Assessment  DOCUMENTATION CODES:   Not applicable  INTERVENTION:    D/C Vital High Protein formula   Start Vital AF 1.2 formula at 20 ml/hr and increase by 10 ml every 4 hours to goal rate of 80 ml/hr  TF regimen to provide 2304 kcals, 144 gm protein, 1557 ml of free water daily  NUTRITION DIAGNOSIS:   Inadequate oral intake related to inability to eat as evidenced by NPO status  GOAL:   Patient will meet greater than or equal to 90% of their needs  MONITOR:   TF tolerance, Diet advancement, Labs, Weight trends, I & O's  REASON FOR ASSESSMENT:   Consult Enteral/tube feeding initiation and management  ASSESSMENT:   60 yo Male referred by Dr. Burr Medico for consultation regarding a new hepatocellular cancer. The patient had elevated liver function test in the fall and was diagnosed with hepatitis C. He was sent to infectious disease and treated for his hepatitis C. Viral loads came back down to undetectable. He had a ultrasound a year ago and an ultrasound 6 months ago without evidence of liver masses. Follow-up ultrasound several weeks ago showed a 5.3 cm echogenic mass in the right hepatic lobe with blood flow. MRI was performed to follow this up. Based on the MRI appearance, considered to be pathognomonic for hepatocellular carcinoma. The patient did not require biopsy. He denies abdominal pain or weight loss. He is not having any diarrhea. He has no jaundice. 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 HIV has also been negative. He overall has good energy and rides a bike. 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   Patient s/p procedure 5/23: OPEN RIGHT PARTIAL NEPHRECTOMY  Patient extubated 5/24. Spoke with wife at bedside. Pt was eating well PTA; no unintentional weight loss. Vital High Protein formula infusing at 30 ml/hr via large bore NGT >> 720 kcals, 63 gm  protein, 602 ml of free water. RD consulted for TF management.  Nutrition focused physical exam completed.  No muscle or subcutaneous fat depletion noticed.  Diet Order:  Diet NPO time specified  Skin:  Reviewed, no issues  Last BM:  5/30  Height:   Ht Readings from Last 1 Encounters:  06/15/2015 5\' 10"  (1.778 m)    Weight:   Wt Readings from Last 1 Encounters:  06/23/15 217 lb 13 oz (98.8 kg)    Ideal Body Weight:  75.4 kg  BMI:  Body mass index is 31.25 kg/(m^2).  Estimated Nutritional Needs:   Kcal:  2200-2400  Protein:  130-140 gm  Fluid:  2.2-2.4 L  EDUCATION NEEDS:   No education needs identified at this time  Arthur Holms, RD, LDN Pager #: [phone] After-Hours Pager #: [phone]

## 2015-06-24 LAB — GLUCOSE, CAPILLARY
GLUCOSE-CAPILLARY: 129 mg/dL — AB (ref 65–99)
GLUCOSE-CAPILLARY: 143 mg/dL — AB (ref 65–99)
GLUCOSE-CAPILLARY: 147 mg/dL — AB (ref 65–99)
GLUCOSE-CAPILLARY: 164 mg/dL — AB (ref 65–99)
Glucose-Capillary: 141 mg/dL — ABNORMAL HIGH (ref 65–99)
Glucose-Capillary: 173 mg/dL — ABNORMAL HIGH (ref 65–99)

## 2015-06-24 LAB — AMMONIA: Ammonia: 29 umol/L (ref 9–35)

## 2015-06-24 MED ORDER — OXYCODONE HCL 5 MG PO TABS
5.0000 mg | ORAL_TABLET | ORAL | Status: DC | PRN
  Administered 2015-06-24 – 2015-06-25 (×3): 5 mg via ORAL
  Filled 2015-06-24 (×3): qty 1

## 2015-06-24 MED ORDER — CETYLPYRIDINIUM CHLORIDE 0.05 % MT LIQD
7.0000 mL | Freq: Two times a day (BID) | OROMUCOSAL | Status: DC
  Administered 2015-06-24 (×2): 7 mL via OROMUCOSAL

## 2015-06-24 MED ORDER — PANTOPRAZOLE SODIUM 40 MG PO TBEC
40.0000 mg | DELAYED_RELEASE_TABLET | Freq: Every day | ORAL | Status: DC
  Administered 2015-06-24: 40 mg via ORAL
  Filled 2015-06-24: qty 1

## 2015-06-24 MED ORDER — PANTOPRAZOLE SODIUM 40 MG PO PACK
40.0000 mg | PACK | Freq: Every day | ORAL | Status: DC
  Filled 2015-06-24: qty 20

## 2015-06-24 NOTE — Progress Notes (Signed)
Attempted to call report to Arbie Cookey RN on 3S, awaiting call back,   Inis Sizer 3:31 PM

## 2015-06-24 NOTE — Progress Notes (Signed)
Patient ID: Joe Allen, male   DOB: 16-Jun-1955, 60 y.o.   MRN: [id number]     [facility]      [address] [place], [place] [address]    Phone: [phone] FAX: [phone]     Subjective: More alert.  Ammonia came down to normal.    Objective:  Vital signs:  Filed Vitals:   06/24/15 0720 06/24/15 0833 06/24/15 0900 06/24/15 1000  BP:  119/70 119/72 123/79  Pulse:  91 89 96  Temp: 99 F (37.2 C)     TempSrc: Oral     Resp:  _0 Height:      Weight:      SpO2:  97% 98% 98%    Last BM Date: 06/23/15  Intake/Output   Yesterday:  05/30 0701 - 05/31 0700 In: 8099 [P.O.:2220; I.V.:1200; NG/GT:1435] Out: 8338 [Urine:1005; Drains:530] This shift: I/O last 3 completed shifts: In: 5695 [P.O.:2220; I.V.:1800; NG/GT:1675] Out: 2505 [Urine:1845; Drains:660] Total I/O In: 83 [I.V.:150; NG/GT:270] Out: 235 [Urine:125; Drains:110]  Physical Exam: General: awakens easily.  Not complaining of pain.   Chest: breathing comfortably CV: Pulses intact. Regular rhythm Abdomen: Soft. non distended. Dressing c/d/i.  Drain serosang, but bili staining c/w serum bilirubin elevated.   Ext: +1 pitting edema.  Skin: No petechiae / purpura     Problem List:   Active Problems:   Cancer, hepatocellular ([place])    Results:   Labs: Results for orders placed or performed during the hospital encounter of 06/21/2015 (from the past 48 hour(s))  Glucose, capillary     Status: Abnormal   Collection Time: 06/22/15 12:50 PM  Result Value Ref Range   Glucose-Capillary 130 (H) 65 - 99 mg/dL   Comment 1 Notify RN   Glucose, capillary     Status: Abnormal   Collection Time: 06/22/15  3:19 PM  Result Value Ref Range   Glucose-Capillary 113 (H) 65 - 99 mg/dL   Comment 1 Notify RN   Glucose, capillary     Status: Abnormal   Collection Time: 06/22/15  7:38 PM  Result Value Ref Range   Glucose-Capillary 144 (H) 65 - 99 mg/dL   Comment 1 Notify RN    Comment 2 Document in Chart   Glucose, capillary     Status: Abnormal   Collection Time: 06/22/15 11:25 PM  Result Value Ref Range   Glucose-Capillary 130 (H) 65 - 99 mg/dL   Comment 1 Notify RN    Comment 2 Document in Chart   Glucose, capillary     Status: Abnormal   Collection Time: 06/23/15  3:15 AM  Result Value Ref Range   Glucose-Capillary 122 (H) 65 - 99 mg/dL   Comment 1 Notify RN    Comment 2 Document in Chart   CBC     Status: Abnormal   Collection Time: 06/23/15  4:39 AM  Result Value Ref Range   WBC 8.6 4.0 - 10.5 K/uL   RBC 3.05 (L) 4.22 - 5.81 MIL/uL   Hemoglobin 9.2 (L) 13.0 - 17.0 g/dL   HCT 27.7 (L) 39.0 - 52.0 %   MCV 90.8 78.0 - 100.0 fL   MCH 30.2 26.0 - 34.0 pg   MCHC 33.2 30.0 - 36.0 g/dL   RDW 15.5 11.5 - 15.5 %   Platelets 119 (L) 150 - 400 K/uL    Comment: CONSISTENT WITH PREVIOUS RESULT  Comprehensive metabolic panel     Status: Abnormal  Collection Time: 06/23/15  4:39 AM  Result Value Ref Range   Sodium 139 135 - 145 mmol/L   Potassium 3.5 3.5 - 5.1 mmol/L   Chloride 108 101 - 111 mmol/L   CO2 26 22 - 32 mmol/L   Glucose, Bld 131 (H) 65 - 99 mg/dL   BUN 14 6 - 20 mg/dL   Creatinine, Ser 0.66 0.61 - 1.24 mg/dL   Calcium 8.7 (L) 8.9 - 10.3 mg/dL   Total Protein 5.2 (L) 6.5 - 8.1 g/dL   Albumin 3.1 (L) 3.5 - 5.0 g/dL   AST 53 (H) 15 - 41 U/L   ALT 56 17 - 63 U/L   Alkaline Phosphatase 51 38 - 126 U/L   Total Bilirubin 4.4 (H) 0.3 - 1.2 mg/dL   GFR calc non Af Amer >60 >60 mL/min   GFR calc Af Amer >60 >60 mL/min    Comment: (NOTE) The eGFR has been calculated using the CKD EPI equation. This calculation has not been validated in all clinical situations. eGFR's persistently <60 mL/min signify possible Chronic Kidney Disease.    Anion gap 5 5 - 15  Protime-INR     Status: Abnormal   Collection Time: 06/23/15  4:39 AM  Result Value Ref Range   Prothrombin Time 21.2 (H) 11.6 - 15.2 seconds   INR 1.84 (H) 0.00 -  1.49  Glucose, capillary     Status: Abnormal   Collection Time: 06/23/15  7:21 AM  Result Value Ref Range   Glucose-Capillary 139 (H) 65 - 99 mg/dL   Comment 1 Notify RN   Glucose, capillary     Status: Abnormal   Collection Time: 06/23/15 11:11 AM  Result Value Ref Range   Glucose-Capillary 142 (H) 65 - 99 mg/dL   Comment 1 Notify RN   Glucose, capillary     Status: Abnormal   Collection Time: 06/23/15  3:12 PM  Result Value Ref Range   Glucose-Capillary 144 (H) 65 - 99 mg/dL   Comment 1 Notify RN   Glucose, capillary     Status: Abnormal   Collection Time: 06/23/15  9:00 PM  Result Value Ref Range   Glucose-Capillary 129 (H) 65 - 99 mg/dL   Comment 1 Capillary Specimen   Glucose, capillary     Status: Abnormal   Collection Time: 06/24/15 12:08 AM  Result Value Ref Range   Glucose-Capillary 143 (H) 65 - 99 mg/dL   Comment 1 Capillary Specimen   Ammonia     Status: None   Collection Time: 06/24/15  3:33 AM  Result Value Ref Range   Ammonia 29 9 - 35 umol/L  Glucose, capillary     Status: Abnormal   Collection Time: 06/24/15  3:37 AM  Result Value Ref Range   Glucose-Capillary 147 (H) 65 - 99 mg/dL   Comment 1 Capillary Specimen   Glucose, capillary     Status: Abnormal   Collection Time: 06/24/15  7:14 AM  Result Value Ref Range   Glucose-Capillary 129 (H) 65 - 99 mg/dL   Comment 1 Capillary Specimen    Comment 2 Notify RN     Imaging / Studies: No results found.  Medications / Allergies:  Scheduled Meds: . sodium chloride   Intravenous Once  . sodium chloride   Intravenous Once  . sodium chloride   Intravenous Once  . lactulose  30 g Oral QID  . metoprolol  5 mg Intravenous Q6H  . pantoprazole (PROTONIX) IV  40 mg Intravenous  QHS  . senna  1 tablet Oral BID   Continuous Infusions: . dextrose 5 % and 0.45 % NaCl with KCl 20 mEq/L 50 mL/hr at 06/24/15 1000  . feeding supplement (VITAL AF 1.2 CAL) 1,000 mL (06/24/15 1000)  . lactated ringers Stopped (06/19/15  2300)   PRN Meds:.acetaminophen **OR** acetaminophen, bisacodyl, fentaNYL (SUBLIMAZE) injection, hydrALAZINE, LORazepam, naLOXone (NARCAN)  injection, ondansetron **OR** ondansetron (ZOFRAN) IV, oxyCODONE, simethicone  Antibiotics: Anti-infectives    Start     Dose/Rate Route Frequency Ordered Stop   05/30/2015 2000  ceFAZolin (ANCEF) IVPB 2g/100 mL premix     2 g 200 mL/hr over 30 Minutes Intravenous Every 8 hours 06/04/2015 1911 06/17/2015 2009   06/10/2015 1030  ceFAZolin (ANCEF) IVPB 2g/100 mL premix     2 g 200 mL/hr over 30 Minutes Intravenous To ShortStay Surgical 06/15/15 1425 05/30/2015 1452       Assessment/Plan HCC, right renal mass POD#8 exploratory laparotomy, right hepatectomy, right open partial nephrectomy--Dr. Barry Dienes, Dr. Louis Meckel -d/c ngt.  Full liquid diet.   Transfer to stepdown. -mobilize PT eval -continue JP drain -change dressing as needed -no malignancy on liver and gallbladder pathology, significant for cirrhosis. I discussed this with the wife. 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  Hepatic encephalopathy-continue lactulose, follow labs.  Ammonia normalized with higher dose of lactulose. Cirrhosis/hep C-s/p treatment for HCV.  HTN-lopressor Heme-coagulopathy.  Acute blood loss anemia. stable VTE prophylaxis-SCDs only FEN-IVF.  Tube feeds until improved mental status.   [facility]   06/24/2015 10:31 AM

## 2015-06-24 NOTE — Clinical Social Work Note (Signed)
Clinical Social Work Assessment  Patient Details  Name: Joe Allen MRN: [id number] Date of Birth: [birth year]  Date of referral:  06/24/15               Reason for consult:  Discharge Planning                Permission sought to share information with:  Chartered certified accountant granted to share information::  Yes, Verbal Permission Granted  Name::     Electrical engineer::  SNFs  Relationship::  Wife  Contact Information:     Housing/Transportation Living arrangements for the past 2 months:  Single Family Home Source of Information:  Spouse, Patient Patient Interpreter Needed:  None Criminal Activity/Legal Involvement Pertinent to Current Situation/Hospitalization:  No - Comment as needed Significant Relationships:  Spouse Lives with:  Spouse Do you feel safe going back to the place where you live?  Yes Need for family participation in patient care:  Yes (Comment)  Care giving concerns:  CSW spoke with patient's wife. She reported if patient needed to go to SNF she is agreeable with plan. She reported that she had set up supports for the patient for care in home also.    Social Worker assessment / plan:  CSW met with patient at beside to complete assessment. Patient was resting comfortably at bedside. CSW explained PT recommendation for SNF placement. CSW explained SNF search and placement process to the patient and answered  his questions. Patient reported his support as his wife. Per patient request CSW called patient's wife.  CSW explained PT recommendation for SNF placement. CSW explained SNF search and placement process to the patient's wife and answered  her questions.  CSW will follow up with bed offers.   Employment status:  Therapist, music:  Managed Care PT Recommendations:  [place] / Referral to community resources:  [place]  Patient/Family's Response to care:  The patient appears happy with  the care he is receiving in hospital and is appreciative of CSW assistance.  Patient/Family's Understanding of and Emotional Response to Diagnosis, Current Treatment, and Prognosis: The patient has a good understanding of why he  was admitted. He has little understanding of the care plan and what he will need post discharge. CSW spoke with patient's wife. She has a good understanding of patient's diagnosis and current treatment. She understands what the patient will need post discharge.   Emotional Assessment Appearance:  Appears stated age Attitude/Demeanor/Rapport:   (Patient was welcoming of CSW and appropriate.) Affect (typically observed):  Accepting, Calm, Appropriate Orientation:  Oriented to Self, Oriented to Place, Oriented to  Time, Oriented to Situation Alcohol / Substance use:  Not Applicable Psych involvement (Current and /or in the community):  No (Comment)  Discharge Needs  Concerns to be addressed:  Discharge Planning Concerns Readmission within the last 30 days:  No Current discharge risk:  Physical Impairment Barriers to Discharge:  Continued Medical Work up   [employer], LCSW 06/24/2015, 4:09 PM

## 2015-06-24 NOTE — NC FL2 (Signed)
[place] LEVEL OF CARE SCREENING TOOL     IDENTIFICATION  Patient Name: Joe Allen Birthdate: 27-May-1955 Sex: male Admission Date (Current Location): 05/31/2015  [facility] and [place] Number:  Herbalist and Address:  The [place] [place], [place] [address], [place], [place] [address]      Provider Number: [id number]  Attending Physician Name and Address:  Stark Klein, MD  Relative Name and Phone Number:       Current Level of Care:   Recommended Level of Care:   Prior Approval Number:    Date Approved/Denied:   PASRR Number: [id number]  Discharge Plan: SNF    Current Diagnoses: Patient Active Problem List   Diagnosis Date Noted  . Cancer, hepatocellular ([place]) 06/10/2015  . Hepatocellular carcinoma ([place]) 05/21/2015  . Liver mass 05/13/2015  . Cirrhosis of liver without ascites ([place]) 01/27/2015  . Transaminitis 01/27/2015  . Chronic hepatitis C without hepatic coma ([place]) 11/04/2014  . HTN (hypertension), benign 02/02/2012    Orientation RESPIRATION BLADDER Height & Weight     Self, Time, Situation, Place  Normal Indwelling catheter Weight: 223 lb 12.3 oz (101.5 kg) Height:  5\' 10"  (177.8 cm)  BEHAVIORAL SYMPTOMS/MOOD NEUROLOGICAL BOWEL NUTRITION STATUS   (None)  (None) Incontinent Diet (Full Liquids)  AMBULATORY STATUS COMMUNICATION OF NEEDS Skin   Extensive Assist Verbally Surgical wounds (Incision Closed: Abdomen Gauze PRN)                       Personal Care Assistance Level of Assistance  Bathing, Feeding, Dressing Bathing Assistance: Limited assistance Feeding assistance: Independent Dressing Assistance: Limited assistance     Functional Limitations Info  Sight, Hearing, Speech Sight Info: Adequate Hearing Info: Adequate Speech Info: Adequate    SPECIAL CARE FACTORS FREQUENCY  PT (By licensed PT), OT (By licensed OT)     PT Frequency: 5/ week OT Frequency: 5/ week            Contractures  Contractures Info: Not present    Additional Factors Info  Code Status, Allergies Code Status Info: Full Allergies Info: Codeine           Current Medications (06/24/2015):  This is the current hospital active medication list Current Facility-Administered Medications  Medication Dose Route Frequency Provider Last Rate Last Dose  . 0.9 %  sodium chloride infusion   Intravenous Once Stark Klein, MD      . 0.9 %  sodium chloride infusion   Intravenous Once Ardis Hughs, MD      . 0.9 %  sodium chloride infusion   Intravenous Once Georganna Skeans, MD      . acetaminophen (TYLENOL) tablet 650 mg  650 mg Oral Q6H PRN Stark Klein, MD   650 mg at 06/20/15 0915   Or  . acetaminophen (TYLENOL) suppository 650 mg  650 mg Rectal Q6H PRN Stark Klein, MD      . antiseptic oral rinse (CPC / CETYLPYRIDINIUM CHLORIDE 0.05%) solution 7 mL  7 mL Mouth Rinse BID Stark Klein, MD   7 mL at 06/24/15 1430  . bisacodyl (DULCOLAX) EC tablet 5 mg  5 mg Oral Daily PRN Stark Klein, MD      . dextrose 5 % and 0.45 % NaCl with KCl 20 mEq/L infusion   Intravenous Continuous Emina Riebock, NP 50 mL/hr at 06/24/15 1500    . feeding supplement (VITAL AF 1.2 CAL) liquid 1,000 mL  1,000 mL Per Tube  Continuous Stark Klein, MD   Stopped at 06/24/15 1015  . fentaNYL (SUBLIMAZE) injection 25-50 mcg  25-50 mcg Intravenous Q2H PRN Emina Riebock, NP   50 mcg at 06/24/15 0738  . hydrALAZINE (APRESOLINE) injection 10 mg  10 mg Intravenous Q2H PRN Stark Klein, MD   10 mg at 06/21/15 0953  . lactated ringers infusion   Intravenous Continuous Stark Klein, MD   Stopped at 06/19/15 2300  . lactulose (CHRONULAC) 10 GM/15ML solution 30 g  30 g Oral QID Stark Klein, MD   30 g at 06/24/15 1440  . LORazepam (ATIVAN) injection 0.5 mg  0.5 mg Intravenous Q4H PRN Erroll Luna, MD      . metoprolol (LOPRESSOR) injection 5 mg  5 mg Intravenous Q6H Emina Riebock, NP   5 mg at 06/24/15 1244  . naloxone (NARCAN) injection 0.4 mg  0.4  mg Intravenous PRN Erroll Luna, MD      . ondansetron (ZOFRAN-ODT) disintegrating tablet 4 mg  4 mg Oral Q6H PRN Stark Klein, MD       Or  . ondansetron (ZOFRAN) injection 4 mg  4 mg Intravenous Q6H PRN Stark Klein, MD      . oxyCODONE (Oxy IR/ROXICODONE) immediate release tablet 5 mg  5 mg Oral Q4H PRN Stark Klein, MD   5 mg at 06/24/15 1032  . pantoprazole sodium (PROTONIX) 40 mg/20 mL oral suspension 40 mg  40 mg Per Tube QHS Stark Klein, MD      . senna (SENOKOT) tablet 8.6 mg  1 tablet Oral BID Stark Klein, MD   8.6 mg at 06/24/15 1032  . simethicone (MYLICON) chewable tablet 40 mg  40 mg Oral Q6H PRN Stark Klein, MD   40 mg at 06/20/15 1636     Discharge Medications: Please see discharge summary for a list of discharge medications.  Relevant Imaging Results:  Relevant Lab Results:   Additional Information SSN:[id number]  Samule Dry, LCSW

## 2015-06-24 NOTE — Progress Notes (Signed)
Urology Inpatient Progress Report Joe Allen  8 Days Post-Op S/p right partial nephrectomy and right partial hepatectomy  Intv/Subj: Patient transferred to stepdown He is more lucid but continues to be confused and somewhat disoriented He complains of no pain or significant issues currently.   Past Medical History  Diagnosis Date  . Hypertension   . Chronic hepatitis C ([place])   . Hypertension   . GERD (gastroesophageal reflux disease)   . Cancer [facility])     liver cancer   Current Facility-Administered Medications  Medication Dose Route Frequency Provider Last Rate Last Dose  . 0.9 %  sodium chloride infusion   Intravenous Once Stark Klein, MD      . 0.9 %  sodium chloride infusion   Intravenous Once Ardis Hughs, MD      . 0.9 %  sodium chloride infusion   Intravenous Once Georganna Skeans, MD      . acetaminophen (TYLENOL) tablet 650 mg  650 mg Oral Q6H PRN Stark Klein, MD   650 mg at 06/20/15 0915   Or  . acetaminophen (TYLENOL) suppository 650 mg  650 mg Rectal Q6H PRN Stark Klein, MD      . antiseptic oral rinse (CPC / CETYLPYRIDINIUM CHLORIDE 0.05%) solution 7 mL  7 mL Mouth Rinse BID Stark Klein, MD   7 mL at 06/24/15 1430  . bisacodyl (DULCOLAX) EC tablet 5 mg  5 mg Oral Daily PRN Stark Klein, MD      . dextrose 5 % and 0.45 % NaCl with KCl 20 mEq/L infusion   Intravenous Continuous Emina Riebock, NP 50 mL/hr at 06/24/15 1600    . feeding supplement (VITAL AF 1.2 CAL) liquid 1,000 mL  1,000 mL Per Tube Continuous Stark Klein, MD   Stopped at 06/24/15 1015  . fentaNYL (SUBLIMAZE) injection 25-50 mcg  25-50 mcg Intravenous Q2H PRN Emina Riebock, NP   50 mcg at 06/24/15 0738  . hydrALAZINE (APRESOLINE) injection 10 mg  10 mg Intravenous Q2H PRN Stark Klein, MD   10 mg at 06/21/15 0953  . lactated ringers infusion   Intravenous Continuous Stark Klein, MD   Stopped at 06/19/15 2300  . lactulose (CHRONULAC) 10 GM/15ML solution 30 g  30 g Oral QID Stark Klein, MD   30 g at  06/24/15 1818  . LORazepam (ATIVAN) injection 0.5 mg  0.5 mg Intravenous Q4H PRN Erroll Luna, MD      . metoprolol (LOPRESSOR) injection 5 mg  5 mg Intravenous Q6H Emina Riebock, NP   5 mg at 06/24/15 1818  . naloxone (NARCAN) injection 0.4 mg  0.4 mg Intravenous PRN Erroll Luna, MD      . ondansetron (ZOFRAN-ODT) disintegrating tablet 4 mg  4 mg Oral Q6H PRN Stark Klein, MD       Or  . ondansetron (ZOFRAN) injection 4 mg  4 mg Intravenous Q6H PRN Stark Klein, MD      . oxyCODONE (Oxy IR/ROXICODONE) immediate release tablet 5 mg  5 mg Oral Q4H PRN Stark Klein, MD   5 mg at 06/24/15 1032  . pantoprazole sodium (PROTONIX) 40 mg/20 mL oral suspension 40 mg  40 mg Per Tube QHS Stark Klein, MD      . senna (SENOKOT) tablet 8.6 mg  1 tablet Oral BID Stark Klein, MD   8.6 mg at 06/24/15 1032  . simethicone (MYLICON) chewable tablet 40 mg  40 mg Oral Q6H PRN Stark Klein, MD   40 mg at 06/20/15 1636  Objective: Vital: Filed Vitals:   06/24/15 1400 06/24/15 1500 06/24/15 1600 06/24/15 1624  BP: 126/79 122/80  114/71  Pulse: 103 107 123 139  Temp:    98.4 F (36.9 C)  TempSrc:    Oral  Resp: 18 16 26 25   Height:    5\' 10"  (1.778 m)  Weight:    102.9 kg (226 lb 13.7 oz)  SpO2: 97% 97% 98% 98%   I/Os: I/O last 3 completed shifts: In: 5695 [P.O.:2220; I.V.:1800; NG/GT:1675] Out: 2505 [Urine:1845; Drains:660]  Physical Exam:  General: No acute distress Abdomen: Incision is intact, mild erythema today right lateral aspect Drain is serous Foley draining clear urine Ext: lower extremities symmetric  Lab Results:  Recent Labs  06/22/15 0852 06/23/15 0439  WBC 9.9 8.6  HGB 9.6* 9.2*  HCT 29.0* 27.7*    Recent Labs  06/22/15 0852 06/23/15 0439  NA 139 139  K 4.1 3.5  CL 108 108  CO2 27 26  GLUCOSE 128* 131*  BUN 14 14  CREATININE 0.69 0.66  CALCIUM 8.7* 8.7*    Recent Labs  06/22/15 0852 06/23/15 0439  INR 1.77* 1.84*   No results for input(s): LABURIN  in the last 72 hours. Results for orders placed or performed during the hospital encounter of 06/05/2015  MRSA PCR Screening     Status: None   Collection Time: 06/10/2015 11:51 PM  Result Value Ref Range Status   MRSA by PCR NEGATIVE NEGATIVE Final    Comment:        The GeneXpert MRSA Assay (FDA approved for NASAL specimens only), is one component of a comprehensive MRSA colonization surveillance program. It is not intended to diagnose MRSA infection nor to guide or monitor treatment for MRSA infections.   Culture, blood (Routine X 2) w Reflex to ID Panel     Status: None (Preliminary result)   Collection Time: 06/20/15 11:38 AM  Result Value Ref Range Status   Specimen Description BLOOD LEFT ANTECUBITAL  Final   Special Requests BOTTLES DRAWN AEROBIC AND ANAEROBIC 10CC  Final   Culture NO GROWTH 4 DAYS  Final   Report Status PENDING  Incomplete  Culture, blood (Routine X 2) w Reflex to ID Panel     Status: None (Preliminary result)   Collection Time: 06/20/15 11:46 AM  Result Value Ref Range Status   Specimen Description BLOOD LEFT HAND  Final   Special Requests BOTTLES DRAWN AEROBIC AND ANAEROBIC 10CC  Final   Culture NO GROWTH 4 DAYS  Final   Report Status PENDING  Incomplete  Culture, Urine     Status: None   Collection Time: 06/20/15 12:40 PM  Result Value Ref Range Status   Specimen Description URINE, CATHETERIZED  Final   Special Requests Normal  Final   Culture NO GROWTH  Final   Report Status 06/21/2015 FINAL  Final    Studies/Results: No results found.  Assessment: 8 Days Post-Op Patient remains mildly encephalopathic otherwise, stable and improving T1a multicystic renal cell carcinoma, negative margins  Plan: Care per general surgery Patient will follow-up with me in approximately 6 months with a labs, CT scan of his abdomen/pelvis and a chest x-ray. The JP drain was sent for creatinine did earlier during this admission and returned as normal serum. A can  be removed per general surgery. I will continue to be available for questions and check in on the patient periodically while he remains in the hospital.   LOS: 8 days  [name],  [name] W 06/24/2015, 6:37 PM

## 2015-06-24 NOTE — Progress Notes (Signed)
Patient transferred to 3S via wheelchair, vital signs stable, attending nurse at the bedside, patient's belongings transferred with him, patient's wife notified.    Inis Sizer

## 2015-06-25 ENCOUNTER — Inpatient Hospital Stay (HOSPITAL_COMMUNITY): Payer: Commercial Managed Care - PPO | Admitting: Certified Registered"

## 2015-06-25 ENCOUNTER — Encounter (HOSPITAL_COMMUNITY): Admission: RE | Disposition: E | Payer: Self-pay | Source: Ambulatory Visit | Attending: General Surgery

## 2015-06-25 ENCOUNTER — Inpatient Hospital Stay (HOSPITAL_COMMUNITY): Payer: Self-pay

## 2015-06-25 ENCOUNTER — Inpatient Hospital Stay (HOSPITAL_COMMUNITY): Payer: Commercial Managed Care - PPO

## 2015-06-25 DIAGNOSIS — D62 Acute posthemorrhagic anemia: Secondary | ICD-10-CM

## 2015-06-25 DIAGNOSIS — E15 Nondiabetic hypoglycemic coma: Secondary | ICD-10-CM

## 2015-06-25 DIAGNOSIS — D689 Coagulation defect, unspecified: Secondary | ICD-10-CM

## 2015-06-25 DIAGNOSIS — C220 Liver cell carcinoma: Secondary | ICD-10-CM

## 2015-06-25 DIAGNOSIS — E872 Acidosis: Secondary | ICD-10-CM

## 2015-06-25 DIAGNOSIS — J9601 Acute respiratory failure with hypoxia: Secondary | ICD-10-CM

## 2015-06-25 HISTORY — PX: LAPAROTOMY: SHX154

## 2015-06-25 LAB — POCT I-STAT 7, (LYTES, BLD GAS, ICA,H+H)
ACID-BASE DEFICIT: 21 mmol/L — AB (ref 0.0–2.0)
ACID-BASE DEFICIT: 19 mmol/L — AB (ref 0.0–2.0)
ACID-BASE DEFICIT: 12 mmol/L — AB (ref 0.0–2.0)
Acid-base deficit: 20 mmol/L — ABNORMAL HIGH (ref 0.0–2.0)
Bicarbonate: 10.8 mEq/L — ABNORMAL LOW (ref 20.0–24.0)
Bicarbonate: 9.9 mEq/L — ABNORMAL LOW (ref 20.0–24.0)
Bicarbonate: 10.9 mEq/L — ABNORMAL LOW (ref 20.0–24.0)
Bicarbonate: 16 mEq/L — ABNORMAL LOW (ref 20.0–24.0)
CALCIUM ION: 0.7 mmol/L — AB (ref 1.12–1.23)
Calcium, Ion: 0.49 mmol/L — CL (ref 1.12–1.23)
Calcium, Ion: 0.76 mmol/L — ABNORMAL LOW (ref 1.12–1.23)
HCT: 18 % — ABNORMAL LOW (ref 39.0–52.0)
HCT: 24 % — ABNORMAL LOW (ref 39.0–52.0)
HEMATOCRIT: 18 % — AB (ref 39.0–52.0)
HEMATOCRIT: 20 % — AB (ref 39.0–52.0)
HEMOGLOBIN: 6.1 g/dL — AB (ref 13.0–17.0)
HEMOGLOBIN: 8.2 g/dL — AB (ref 13.0–17.0)
HEMOGLOBIN: 6.1 g/dL — AB (ref 13.0–17.0)
HEMOGLOBIN: 6.8 g/dL — AB (ref 13.0–17.0)
O2 SAT: 99 %
O2 SAT: 99 %
O2 SAT: 91 %
O2 Saturation: 90 %
PCO2 ART: 45.5 mmHg — AB (ref 35.0–45.0)
PCO2 ART: 46.2 mmHg — AB (ref 35.0–45.0)
PH ART: 6.938 — AB (ref 7.350–7.450)
PH ART: 6.977 — AB (ref 7.350–7.450)
PH ART: 6.964 — AB (ref 7.350–7.450)
PH ART: 7.14 — AB (ref 7.350–7.450)
PO2 ART: 208 mmHg — AB (ref 80.0–100.0)
PO2 ART: 91 mmHg (ref 80.0–100.0)
PO2 ART: 182 mmHg — AB (ref 80.0–100.0)
POTASSIUM: 5.3 mmol/L — AB (ref 3.5–5.1)
POTASSIUM: 5 mmol/L (ref 3.5–5.1)
POTASSIUM: 5.7 mmol/L — AB (ref 3.5–5.1)
Patient temperature: 37.3
Potassium: 4.4 mmol/L (ref 3.5–5.1)
SODIUM: 152 mmol/L — AB (ref 135–145)
SODIUM: 147 mmol/L — AB (ref 135–145)
Sodium: 136 mmol/L (ref 135–145)
Sodium: 139 mmol/L (ref 135–145)
TCO2: 11 mmol/L (ref 0–100)
TCO2: 12 mmol/L (ref 0–100)
TCO2: 12 mmol/L (ref 0–100)
TCO2: 17 mmol/L (ref 0–100)
pCO2 arterial: 47.9 mmHg — ABNORMAL HIGH (ref 35.0–45.0)
pCO2 arterial: 46.9 mmHg — ABNORMAL HIGH (ref 35.0–45.0)
pO2, Arterial: 80 mmHg (ref 80.0–100.0)

## 2015-06-25 LAB — POCT I-STAT 3, ART BLOOD GAS (G3+)
Acid-base deficit: 15 mmol/L — ABNORMAL HIGH (ref 0.0–2.0)
Bicarbonate: 11.7 mEq/L — ABNORMAL LOW (ref 20.0–24.0)
O2 Saturation: 100 %
PCO2 ART: 28.2 mmHg — AB (ref 35.0–45.0)
PH ART: 7.225 — AB (ref 7.350–7.450)
PO2 ART: 297 mmHg — AB (ref 80.0–100.0)
TCO2: 13 mmol/L (ref 0–100)

## 2015-06-25 LAB — BASIC METABOLIC PANEL
Anion gap: 12 (ref 5–15)
BUN: 12 mg/dL (ref 6–20)
CHLORIDE: 110 mmol/L (ref 101–111)
CO2: 11 mmol/L — AB (ref 22–32)
CREATININE: 1.19 mg/dL (ref 0.61–1.24)
Calcium: 6.5 mg/dL — ABNORMAL LOW (ref 8.9–10.3)
GFR calc non Af Amer: >60 mL/min (ref >60)
Glucose, Bld: 56 mg/dL — ABNORMAL LOW (ref 65–99)
Potassium: 4.1 mmol/L (ref 3.5–5.1)
Sodium: 133 mmol/L — ABNORMAL LOW (ref 135–145)

## 2015-06-25 LAB — PREPARE RBC (CROSSMATCH)

## 2015-06-25 LAB — CULTURE, BLOOD (ROUTINE X 2)
Culture: NO GROWTH
Culture: NO GROWTH

## 2015-06-25 LAB — CBC
HEMATOCRIT: 30.1 % — AB (ref 39.0–52.0)
HEMATOCRIT: 19 % — AB (ref 39.0–52.0)
HEMOGLOBIN: 6.1 g/dL — AB (ref 13.0–17.0)
Hemoglobin: 9.8 g/dL — ABNORMAL LOW (ref 13.0–17.0)
MCH: 29.5 pg (ref 26.0–34.0)
MCH: 28.8 pg (ref 26.0–34.0)
MCHC: 32.6 g/dL (ref 30.0–36.0)
MCHC: 32.1 g/dL (ref 30.0–36.0)
MCV: 90.7 fL (ref 78.0–100.0)
MCV: 89.6 fL (ref 78.0–100.0)
PLATELETS: 94 10*3/uL — AB (ref 150–400)
Platelets: 22 10*3/uL — CL (ref 150–400)
RBC: 3.32 MIL/uL — ABNORMAL LOW (ref 4.22–5.81)
RBC: 2.12 MIL/uL — AB (ref 4.22–5.81)
RDW: 15.3 % (ref 11.5–15.5)
RDW: 14 % (ref 11.5–15.5)
WBC: 2.1 10*3/uL — AB (ref 4.0–10.5)
WBC: 8.5 10*3/uL (ref 4.0–10.5)

## 2015-06-25 LAB — PROTIME-INR
INR: 1.73 — ABNORMAL HIGH (ref 0.00–1.49)
Prothrombin Time: 20.3 seconds — ABNORMAL HIGH (ref 11.6–15.2)

## 2015-06-25 LAB — DIC (DISSEMINATED INTRAVASCULAR COAGULATION) PANEL
INR: 7.25 — AB (ref 0.00–1.49)
PLATELETS: 22 10*3/uL — AB (ref 150–400)

## 2015-06-25 LAB — DIC (DISSEMINATED INTRAVASCULAR COAGULATION)PANEL
Fibrinogen: >800 mg/dL — ABNORMAL HIGH (ref 204–475)
Prothrombin Time: 59.4 seconds — ABNORMAL HIGH (ref 11.6–15.2)
Smear Review: NONE SEEN
aPTT: 56 seconds — ABNORMAL HIGH (ref 24–37)

## 2015-06-25 LAB — TROPONIN I: Troponin I: 0.11 ng/mL — ABNORMAL HIGH (ref <0.031)

## 2015-06-25 LAB — GLUCOSE, CAPILLARY
GLUCOSE-CAPILLARY: 137 mg/dL — AB (ref 65–99)
GLUCOSE-CAPILLARY: 142 mg/dL — AB (ref 65–99)
GLUCOSE-CAPILLARY: 165 mg/dL — AB (ref 65–99)

## 2015-06-25 LAB — FIBRINOGEN: Fibrinogen: 123 mg/dL — ABNORMAL LOW (ref 204–475)

## 2015-06-25 LAB — AMMONIA: Ammonia: 23 umol/L (ref 9–35)

## 2015-06-25 LAB — LACTIC ACID, PLASMA: Lactic Acid, Venous: 9.4 mmol/L (ref 0.5–2.0)

## 2015-06-25 LAB — MASSIVE TRANSFUSION PROTOCOL ORDER (BLOOD BANK NOTIFICATION)

## 2015-06-25 SURGERY — LAPAROTOMY, EXPLORATORY
Anesthesia: General | Site: Abdomen

## 2015-06-25 MED ORDER — PROPOFOL 10 MG/ML IV BOLUS
INTRAVENOUS | Status: DC | PRN
  Administered 2015-06-25 (×2): 50 mg via INTRAVENOUS

## 2015-06-25 MED ORDER — PHENYLEPHRINE HCL 10 MG/ML IJ SOLN
10.0000 mg | INTRAVENOUS | Status: DC | PRN
  Administered 2015-06-25: 200 ug/min via INTRAVENOUS

## 2015-06-25 MED ORDER — SODIUM CHLORIDE 0.9 % IV SOLN: INTRAVENOUS | Status: DC

## 2015-06-25 MED ORDER — ROCURONIUM BROMIDE 100 MG/10ML IV SOLN
INTRAVENOUS | Status: DC | PRN
  Administered 2015-06-25: 100 mg via INTRAVENOUS
  Administered 2015-06-25: 50 mg via INTRAVENOUS

## 2015-06-25 MED ORDER — MIDAZOLAM HCL 2 MG/2ML IJ SOLN: 1.0000 mg | INTRAMUSCULAR | Status: DC | PRN

## 2015-06-25 MED ORDER — EPINEPHRINE HCL 1 MG/ML IJ SOLN
4000.0000 ug | INTRAVENOUS | Status: DC | PRN
  Administered 2015-06-25: 10 ug/min via INTRAVENOUS

## 2015-06-25 MED ORDER — SODIUM BICARBONATE 4.2 % IV SOLN
INTRAVENOUS | Status: DC | PRN
  Administered 2015-06-25 (×7): 50 meq via INTRAVENOUS

## 2015-06-25 MED ORDER — PHENYLEPHRINE HCL 10 MG/ML IJ SOLN
INTRAMUSCULAR | Status: DC | PRN
  Administered 2015-06-25: 200 ug via INTRAVENOUS
  Administered 2015-06-25 (×2): 120 ug via INTRAVENOUS
  Administered 2015-06-25 (×2): 200 ug via INTRAVENOUS
  Administered 2015-06-25: 160 ug via INTRAVENOUS

## 2015-06-25 MED ORDER — SODIUM CHLORIDE 0.9 % IV SOLN
Freq: Once | INTRAVENOUS | Status: AC
  Administered 2015-06-25: 11:00:00 via INTRAVENOUS

## 2015-06-25 MED ORDER — HEMOSTATIC AGENTS (NO CHARGE) OPTIME
TOPICAL | Status: DC | PRN
  Administered 2015-06-25 (×7): 1 via TOPICAL

## 2015-06-25 MED ORDER — TRANEXAMIC ACID 1000 MG/10ML IV SOLN
1000.0000 mg | INTRAVENOUS | Status: AC
  Administered 2015-06-25: 1000 mg via INTRAVENOUS
  Filled 2015-06-25: qty 10

## 2015-06-25 MED ORDER — PIPERACILLIN-TAZOBACTAM 3.375 G IVPB
3.3750 g | Freq: Three times a day (TID) | INTRAVENOUS | Status: DC
  Filled 2015-06-25: qty 50

## 2015-06-25 MED ORDER — DEXTROSE 5 % IV SOLN
0.0000 ug/min | INTRAVENOUS | Status: DC
  Administered 2015-06-25: 20 ug/min via INTRAVENOUS
  Filled 2015-06-25: qty 1

## 2015-06-25 MED ORDER — INSULIN ASPART 100 UNIT/ML ~~LOC~~ SOLN: 0.0000 [IU] | SUBCUTANEOUS | Status: DC

## 2015-06-25 MED ORDER — EPINEPHRINE HCL 1 MG/ML IJ SOLN
INTRAMUSCULAR | Status: DC | PRN
  Administered 2015-06-25 (×2): .3 mg via INTRAVENOUS
  Administered 2015-06-25 (×2): .5 mg via INTRAVENOUS
  Administered 2015-06-25 (×2): 1 mg via INTRAVENOUS
  Administered 2015-06-25: .1 mg via INTRAVENOUS
  Administered 2015-06-25 (×3): 1 mg via INTRAVENOUS
  Administered 2015-06-25: .5 mg via INTRAVENOUS
  Administered 2015-06-25: 1 mg via INTRAVENOUS
  Administered 2015-06-25: .1 mg via INTRAVENOUS
  Administered 2015-06-25: 1 mg via INTRAVENOUS
  Administered 2015-06-25: .5 mg via INTRAVENOUS
  Administered 2015-06-25: 1 mg via INTRAVENOUS

## 2015-06-25 MED ORDER — FENTANYL CITRATE (PF) 100 MCG/2ML IJ SOLN
INTRAMUSCULAR | Status: DC | PRN
  Administered 2015-06-25: 150 ug via INTRAVENOUS
  Administered 2015-06-25: 100 ug via INTRAVENOUS

## 2015-06-25 MED ORDER — CALCIUM CHLORIDE 10 % IV SOLN
INTRAVENOUS | Status: DC | PRN
  Administered 2015-06-25: 1000 mg via INTRAVENOUS
  Administered 2015-06-25 (×6): 250 mg via INTRAVENOUS
  Administered 2015-06-25 (×2): 500 mg via INTRAVENOUS
  Administered 2015-06-25 (×3): 1000 mg via INTRAVENOUS
  Administered 2015-06-25: 500 mg via INTRAVENOUS
  Administered 2015-06-25: 250 mg via INTRAVENOUS
  Administered 2015-06-25: 1000 mg via INTRAVENOUS
  Administered 2015-06-25: 250 mg via INTRAVENOUS

## 2015-06-25 MED ORDER — MIDAZOLAM HCL 5 MG/5ML IJ SOLN
INTRAMUSCULAR | Status: DC | PRN
  Administered 2015-06-25: 2 mg via INTRAVENOUS

## 2015-06-25 MED ORDER — NOREPINEPHRINE BITARTRATE 1 MG/ML IV SOLN
0.0000 ug/min | INTRAVENOUS | Status: DC
  Filled 2015-06-25: qty 4

## 2015-06-25 MED ORDER — PIPERACILLIN-TAZOBACTAM 3.375 G IVPB 30 MIN
3.3750 g | Freq: Once | INTRAVENOUS | Status: DC
  Filled 2015-06-25: qty 50

## 2015-06-25 MED ORDER — STERILE WATER FOR IRRIGATION IR SOLN
Status: DC | PRN
  Administered 2015-06-25 (×2): 1000 mL

## 2015-06-25 MED ORDER — VASOPRESSIN 20 UNIT/ML IV SOLN
INTRAVENOUS | Status: DC | PRN
  Administered 2015-06-25 (×2): 1 [IU] via INTRAVENOUS
  Administered 2015-06-25 (×2): 3 [IU] via INTRAVENOUS
  Administered 2015-06-25: 6 [IU] via INTRAVENOUS
  Administered 2015-06-25 (×2): 10 [IU] via INTRAVENOUS
  Administered 2015-06-25 (×2): 3 [IU] via INTRAVENOUS

## 2015-06-25 MED ORDER — VASOPRESSIN 20 UNIT/ML IV SOLN
0.0300 [IU]/min | Freq: Once | INTRAVENOUS | Status: DC
  Filled 2015-06-25: qty 2

## 2015-06-25 MED ORDER — VANCOMYCIN HCL 10 G IV SOLR
2000.0000 mg | Freq: Once | INTRAVENOUS | Status: DC
  Filled 2015-06-25: qty 2000

## 2015-06-25 MED ORDER — SODIUM CHLORIDE 0.9 % IV BOLUS (SEPSIS)
1000.0000 mL | Freq: Once | INTRAVENOUS | Status: AC
  Administered 2015-06-25: 1000 mL via INTRAVENOUS

## 2015-06-25 MED ORDER — EPINEPHRINE HCL 1 MG/ML IJ SOLN
0.5000 ug/min | INTRAMUSCULAR | Status: DC
  Filled 2015-06-25: qty 4

## 2015-06-25 MED ORDER — PANTOPRAZOLE SODIUM 40 MG IV SOLR: 40.0000 mg | INTRAVENOUS | Status: DC

## 2015-06-25 MED ORDER — SODIUM CHLORIDE 0.9 % IV SOLN: Freq: Once | INTRAVENOUS | Status: DC

## 2015-06-25 MED ORDER — FENTANYL CITRATE (PF) 100 MCG/2ML IJ SOLN
25.0000 ug | INTRAMUSCULAR | Status: DC | PRN
Start: 2015-06-25 — End: 2015-06-26

## 2015-06-25 MED ORDER — 0.9 % SODIUM CHLORIDE (POUR BTL) OPTIME
TOPICAL | Status: DC | PRN
  Administered 2015-06-25 (×2): 1000 mL

## 2015-06-25 MED ORDER — CEFAZOLIN SODIUM 1 G IJ SOLR
INTRAMUSCULAR | Status: DC | PRN
  Administered 2015-06-25: 2 g via INTRAMUSCULAR

## 2015-06-25 MED ORDER — NOREPINEPHRINE BITARTRATE 1 MG/ML IV SOLN
4000.0000 ug | INTRAVENOUS | Status: DC | PRN
  Administered 2015-06-25: 3 ug/min via INTRAVENOUS

## 2015-06-25 SURGICAL SUPPLY — 79 items
ADAPTER CATH URET PLST 4-6FR (CATHETERS) ×6 IMPLANT
BAG BILE T-TUBES STRL (MISCELLANEOUS) ×6 IMPLANT
BLADE SURG 10 STRL SS (BLADE) ×3 IMPLANT
BLADE SURG ROTATE 9660 (MISCELLANEOUS) IMPLANT
BNDG GAUZE ELAST 4 BULKY (GAUZE/BANDAGES/DRESSINGS) ×6 IMPLANT
CANISTER SUCTION 2500CC (MISCELLANEOUS) ×6 IMPLANT
CATH TROCAR 32FR (CATHETERS) ×6 IMPLANT
CHLORAPREP W/TINT 26ML (MISCELLANEOUS) ×3 IMPLANT
CLIP TI LARGE 6 (CLIP) ×6 IMPLANT
CONN Y 3/8X3/8X3/8  BEN (MISCELLANEOUS) ×2
CONN Y 3/8X3/8X3/8 BEN (MISCELLANEOUS) ×1 IMPLANT
CONNECTOR 5 IN 1 STRAIGHT STRL (MISCELLANEOUS) ×3 IMPLANT
COVER SURGICAL LIGHT HANDLE (MISCELLANEOUS) ×3 IMPLANT
DRAIN CHANNEL 19F RND (DRAIN) ×3 IMPLANT
DRAPE LAPAROSCOPIC ABDOMINAL (DRAPES) ×3 IMPLANT
DRAPE PROXIMA HALF (DRAPES) ×3 IMPLANT
DRAPE WARM FLUID 44X44 (DRAPE) ×3 IMPLANT
DRSG OPSITE POSTOP 4X10 (GAUZE/BANDAGES/DRESSINGS) IMPLANT
DRSG OPSITE POSTOP 4X8 (GAUZE/BANDAGES/DRESSINGS) IMPLANT
DRSG PAD ABDOMINAL 8X10 ST (GAUZE/BANDAGES/DRESSINGS) ×9 IMPLANT
ELECT BLADE 6.5 EXT (BLADE) IMPLANT
ELECT CAUTERY BLADE 6.4 (BLADE) ×3 IMPLANT
ELECT PAD DSPR THERM+ ADLT (MISCELLANEOUS) ×3 IMPLANT
ELECT REM PT RETURN 9FT ADLT (ELECTROSURGICAL) ×3
ELECTRODE REM PT RTRN 9FT ADLT (ELECTROSURGICAL) ×1 IMPLANT
FELT TEFLON 6X6 (MISCELLANEOUS) ×3 IMPLANT
GAUZE SPONGE 4X4 12PLY STRL (GAUZE/BANDAGES/DRESSINGS) ×3 IMPLANT
GLOVE BIO SURGEON STRL SZ 6 (GLOVE) ×6 IMPLANT
GLOVE BIO SURGEON STRL SZ8 (GLOVE) ×3 IMPLANT
GLOVE BIOGEL PI IND STRL 6.5 (GLOVE) ×1 IMPLANT
GLOVE BIOGEL PI IND STRL 7.0 (GLOVE) ×1 IMPLANT
GLOVE BIOGEL PI INDICATOR 6.5 (GLOVE) ×2
GLOVE BIOGEL PI INDICATOR 7.0 (GLOVE) ×2
GLOVE SS N UNI LF 7.0 STRL (GLOVE) ×3 IMPLANT
GLOVE SURG SIGNA 7.5 PF LTX (GLOVE) ×3 IMPLANT
GLOVE SURG SS PI 7.0 STRL IVOR (GLOVE) ×6 IMPLANT
GOWN STRL REUS W/ TWL LRG LVL3 (GOWN DISPOSABLE) ×1 IMPLANT
GOWN STRL REUS W/TWL 2XL LVL3 (GOWN DISPOSABLE) ×3 IMPLANT
GOWN STRL REUS W/TWL LRG LVL3 (GOWN DISPOSABLE) ×2
HAND PENCIL TRP OPTION (MISCELLANEOUS) ×3 IMPLANT
HEMOSTAT SNOW SURGICEL 2X4 (HEMOSTASIS) ×6 IMPLANT
KIT BASIN OR (CUSTOM PROCEDURE TRAY) ×3 IMPLANT
KIT REMOVER STAPLE SKIN (MISCELLANEOUS) ×3 IMPLANT
KIT ROOM TURNOVER OR (KITS) ×3 IMPLANT
LIGASURE IMPACT 36 18CM CVD LR (INSTRUMENTS) IMPLANT
MANIFOLD NEPTUNE WASTE (CANNULA) ×3 IMPLANT
NS IRRIG 1000ML POUR BTL (IV SOLUTION) ×6 IMPLANT
PACK GENERAL/GYN (CUSTOM PROCEDURE TRAY) ×3 IMPLANT
PAD ABD 8X10 STRL (GAUZE/BANDAGES/DRESSINGS) ×6 IMPLANT
PAD ARMBOARD 7.5X6 YLW CONV (MISCELLANEOUS) ×3 IMPLANT
SEALANT PATCH FIBRIN 2X4IN (MISCELLANEOUS) ×6 IMPLANT
SPECIMEN JAR LARGE (MISCELLANEOUS) IMPLANT
SPONGE LAP 18X18 X RAY DECT (DISPOSABLE) ×42 IMPLANT
SPONGE SURGIFOAM ABS GEL 100 (HEMOSTASIS) ×6 IMPLANT
STAPLER VISISTAT 35W (STAPLE) ×3 IMPLANT
SUCTION POOLE TIP (SUCTIONS) ×3 IMPLANT
SURGIFLO W/THROMBIN 8M KIT (HEMOSTASIS) ×6 IMPLANT
SUT ETHILON 3 0 FSL (SUTURE) ×3 IMPLANT
SUT PDS AB 1 TP1 54 (SUTURE) ×3 IMPLANT
SUT PDS AB 1 TP1 96 (SUTURE) ×6 IMPLANT
SUT PDS II 0 TP-1 LOOPED 60 (SUTURE) IMPLANT
SUT PROLENE 3 0 SH 48 (SUTURE) ×15 IMPLANT
SUT PROLENE 4 0 RB 1 (SUTURE) ×2
SUT PROLENE 4-0 RB1 .5 CRCL 36 (SUTURE) ×1 IMPLANT
SUT SILK 0 FSL (SUTURE) ×9 IMPLANT
SUT VIC AB 2-0 SH 18 (SUTURE) ×3 IMPLANT
SUT VIC AB 3-0 SH 18 (SUTURE) ×3 IMPLANT
SUT VICRYL 4-0 PS2 18IN ABS (SUTURE) IMPLANT
SUT VICRYL AB 2 0 TIES (SUTURE) ×3 IMPLANT
SUT VICRYL AB 3 0 TIES (SUTURE) ×3 IMPLANT
SYSTEM SAHARA CHEST DRAIN ATS (WOUND CARE) ×6 IMPLANT
SYSTEM SAHARA CHEST DRAIN RE-I (WOUND CARE) ×6 IMPLANT
TOWEL OR 17X24 6PK STRL BLUE (TOWEL DISPOSABLE) ×3 IMPLANT
TOWEL OR 17X26 10 PK STRL BLUE (TOWEL DISPOSABLE) ×3 IMPLANT
TRAY FOLEY CATH 16FRSI W/METER (SET/KITS/TRAYS/PACK) IMPLANT
TUBE CONNECTING 12'X1/4 (SUCTIONS) ×3
TUBE CONNECTING 12X1/4 (SUCTIONS) ×6 IMPLANT
TUBE SUCT ARGYLE STRL (TUBING) ×6 IMPLANT
YANKAUER SUCT BULB TIP NO VENT (SUCTIONS) ×3 IMPLANT

## 2015-06-25 DEATH — deceased

## 2015-06-26 ENCOUNTER — Encounter: Payer: Self-pay | Admitting: Student in an Organized Health Care Education/Training Program

## 2015-06-26 ENCOUNTER — Encounter (HOSPITAL_COMMUNITY): Payer: Self-pay | Admitting: General Surgery

## 2015-06-26 LAB — TYPE AND SCREEN
ABO/RH(D): O POS
Antibody Screen: NEGATIVE
UNIT DIVISION: 0
UNIT DIVISION: 0
UNIT DIVISION: 0
UNIT DIVISION: 0
UNIT DIVISION: 0
UNIT DIVISION: 0
UNIT DIVISION: 0
UNIT DIVISION: 0
UNIT DIVISION: 0
UNIT DIVISION: 0
UNIT DIVISION: 0
UNIT DIVISION: 0
UNIT DIVISION: 0
UNIT DIVISION: 0
UNIT DIVISION: 0
UNIT DIVISION: 0
Unit division: 0
Unit division: 0
Unit division: 0
Unit division: 0
Unit division: 0
Unit division: 0
Unit division: 0
Unit division: 0
Unit division: 0
Unit division: 0
Unit division: 0
Unit division: 0
Unit division: 0
Unit division: 0
Unit division: 0
Unit division: 0
Unit division: 0
Unit division: 0

## 2015-06-26 LAB — PREPARE CRYOPRECIPITATE
UNIT DIVISION: 0
Unit division: 0
Unit division: 0
Unit division: 0

## 2015-06-26 LAB — PREPARE FRESH FROZEN PLASMA
UNIT DIVISION: 0
UNIT DIVISION: 0
UNIT DIVISION: 0
UNIT DIVISION: 0
UNIT DIVISION: 0
UNIT DIVISION: 0
UNIT DIVISION: 0
UNIT DIVISION: 0
UNIT DIVISION: 0
UNIT DIVISION: 0
UNIT DIVISION: 0
UNIT DIVISION: 0
UNIT DIVISION: 0
UNIT DIVISION: 0
UNIT DIVISION: 0
UNIT DIVISION: 0
UNIT DIVISION: 0
UNIT DIVISION: 0
UNIT DIVISION: 0
UNIT DIVISION: 0
UNIT DIVISION: 0
UNIT DIVISION: 0
Unit division: 0
Unit division: 0
Unit division: 0
Unit division: 0
Unit division: 0
Unit division: 0
Unit division: 0
Unit division: 0
Unit division: 0
Unit division: 0
Unit division: 0
Unit division: 0

## 2015-06-26 LAB — PREPARE PLATELET PHERESIS
UNIT DIVISION: 0
Unit division: 0
Unit division: 0

## 2015-06-26 NOTE — Progress Notes (Signed)
Chaplain was paged to escort family down to the [facility] after escorting the family to see the body chaplain  Gathered post-morum  information from family. The next of kin is the Pt's wife Gwenette Greet [phone]). She reported wanting [facility] to pick up the body. Chaplain provided spiritual support via prayer.

## 2015-06-26 NOTE — Addendum Note (Signed)
Addendum  created 06/26/15 0620 by Freddie Breech, CRNA   Modules edited: Anesthesia Attestations, Anesthesia Events, Anesthesia Flowsheet, Anesthesia Medication Administration

## 2015-06-26 NOTE — Progress Notes (Signed)
[facility] form left in box 07-10-15

## 2015-06-28 LAB — BODY FLUID CULTURE

## 2015-06-30 ENCOUNTER — Encounter: Payer: Self-pay | Admitting: Student in an Organized Health Care Education/Training Program

## 2015-06-30 NOTE — Progress Notes (Signed)
Patient deceased. I will fax forms back to [employer] to advise.

## 2015-07-25 NOTE — Significant Event (Signed)
Rapid Response Event Note  Overview: Time Called: 0830 Arrival Time: [id number]    Initial Focused Assessment: Patient being assisted back to bed with lift. Patient hyperventilating, and moaning.  With sudden change in mental status over the past few minutes. BP 143/72  ST 120s  RR 42  O2 sats 95% on RA Legs mottled, per staff this is unchanged from yesterday. JP draining frank red blood Foley with bloody urine  Interventions: Placed on 100% NRB O2 stas 100% and RR improved to 24 2nd PIV started Labs drawn and sent NS bolus started 999 Emptying JP drain every couple of minutes, frank red blood. Dr Barry Dienes at bedside 1st unit of emergency release blood started Transferred to 2s04 via bed with O2 and heart monitor CCM at bedside    Event Summary: Name of Physician Notified: Barry Dienes at [place]  Name of Consulting Physician Notified: nestor at [place]: Transferred (Comment) (2S04)  Event End Time: [place]  Raliegh Ip

## 2015-07-25 NOTE — Progress Notes (Addendum)
While rounding, I was asked to visit with patients wife and family in the surgical waiting room. I introduced himself and informed the family of my availability if they should need spiritual support.  I was asked to offer prayer. I followed up with the family two times over the next three hours. I responded to a page requesting a Chaplain for the family in short stay. The patient had expired and the  family had not been notified.Upon arrival, another Chaplain was on site and was with them as they were informed. I escorted the family to see the body and gathered information for the charge nurse.   Graciela Husbands Pager: [phone]

## 2015-07-25 NOTE — Consult Note (Signed)
PULMONARY / CRITICAL CARE MEDICINE   Name: Joe Allen MRN: [id number] DOB: 1955-11-05    ADMISSION DATE:  06/22/2015 CONSULTATION DATE:  01-Jul-2015  REFERRING MD:  Stark Klein, M.D. / CCS  CHIEF COMPLAINT:  Shock & Respiratory Failure  HISTORY OF PRESENT ILLNESS:   60 year old male with known history of liver cirrhosis. Underwent right hepatic lobe partial hepatectomy with cholecystectomy and right nephrectomy on 5/23. This morning patient began to have periods of hypotension with increasing bloody output from not only his Foley catheter but his intra-abdominal drain. He was emergently transferred to the intensive care unit for further stabilization. At the time of my assessment patient was altered and unable to provide further history. He did have a coagulopathy likely secondary to his underlying cirrhosis.  PAST MEDICAL HISTORY :  He  has a past medical history of Hypertension; Chronic hepatitis C ([place]); Hypertension; GERD (gastroesophageal reflux disease); and Cancer ([place]).  PAST SURGICAL HISTORY: He  has past surgical history that includes colonoscopy (01/24/2010); Polypectomy (2009); Facial reconstruction surgery; Laparoscopic hepatectomy (Right, 05/30/2015); and Nephrectomy (N/A, 06/12/2015).  Allergies  Allergen Reactions  . Codeine Nausea And Vomiting and Other (See Comments)     PER PATIENT GIVES UPSET STOMACH    No current facility-administered medications on file prior to encounter.   Current Outpatient Prescriptions on File Prior to Encounter  Medication Sig  . lisinopril (PRINIVIL,ZESTRIL) 20 MG tablet Take 1 tablet (20 mg total) by mouth daily.  Marland Kitchen omeprazole (PRILOSEC) 20 MG capsule Take 20 mg by mouth daily.  . sildenafil (VIAGRA) 100 MG tablet Take 0.5-1 tablets (50-100 mg total) by mouth daily as needed for erectile dysfunction.    FAMILY HISTORY:  His indicated that his mother is alive. He indicated that his father is alive. He indicated that his sister is  alive.   SOCIAL HISTORY: He  reports that he quit smoking about 5 years ago. He has never used smokeless tobacco. He reports that he does not drink alcohol or use illicit drugs.  REVIEW OF SYSTEMS:  Unable to obtain given intubation & sedation.  SUBJECTIVE:   VITAL SIGNS: BP 105/64 mmHg  Pulse 150  Temp(Src) 103.4 F (39.7 C) (Axillary)  Resp 32  Ht 5\' 10"  (1.778 m)  Wt 226 lb 13.7 oz (102.9 kg)  BMI 32.55 kg/m2  SpO2 100%  HEMODYNAMICS:    VENTILATOR SETTINGS: Vent Mode:  [-] PRVC FiO2 (%):  [100 %] 100 % Set Rate:  [32 bmp] 32 bmp Vt Set:  [600 mL] 600 mL PEEP:  [5 cmH20] 5 cmH20 Plateau Pressure:  [15 cmH20] 15 cmH20  INTAKE / OUTPUT: I/O last 3 completed shifts: In: [id number] [P.O.:1920; I.V.:1550; Other:100; NG/GT:1190] Out: 2631 [Urine:1815; Drains:815; Stool:1]  PHYSICAL EXAMINATION: General:  Significant distress. Nursing staff at bedside.  Integument:  Warm & dry. Mottling on patient's skin. Tattoo is noted. Lymphatics:  No appreciated cervical or supraclavicular lymphadenoapthy. HEENT: Dry mucous membranes. Pupils equal. Nonrebreather mask in place. Cardiovascular:  Tachycardic with regular rhythm. No edema. No appreciable JVD.  Pulmonary:  Severely increased work of breathing. Overall decreased breath sounds bilateral lung bases. Increased work of breathing despite nonrebreather mask. Abdomen: Soft. Normal bowel sounds. Mild abdominal distention. Frank blood from intra-abdominal drain. Musculoskeletal:  Normal bulk and tone. No joint deformity or effusion appreciated. Neurological:  Spontaneously moving all 4 extremities. Not following commands. Psychiatric:  Unable to assess given altered mentation.   LABS:  BMET  Recent Labs Lab 06/21/15 0330 06/22/15 [id number] 06/23/15 [id number]  NA 136 139 139  K 4.1 4.1 3.5  CL 103 108 108  CO2 27 27 26   BUN 11 14 14   CREATININE 0.76 0.69 0.66  GLUCOSE 129* 128* 131*    Electrolytes  Recent Labs Lab 06/21/15 0330  06/22/15 0852 06/23/15 0439  CALCIUM 8.6* 8.7* 8.7*    CBC  Recent Labs Lab 06/22/15 0852 06/23/15 0439 Jul 18, 2015 0845  WBC 9.9 8.6 2.1*  HGB 9.6* 9.2* 9.8*  HCT 29.0* 27.7* 30.1*  PLT 133* 119* 94*    Coag's  Recent Labs Lab 06/22/15 0852 06/23/15 0439 07/18/2015 0845  INR 1.77* 1.84* 1.73*    Sepsis Markers No results for input(s): LATICACIDVEN, PROCALCITON, O2SATVEN in the last 168 hours.  ABG  Recent Labs Lab 06/19/15 1453 07-18-2015 1001  PHART 7.446 7.225*  PCO2ART 44.2 28.2*  PO2ART 73.0* 297.0*    Liver Enzymes  Recent Labs Lab 06/21/15 0330 06/22/15 0852 06/23/15 0439  AST 58* 51* 53*  ALT 70* 59 56  ALKPHOS 35* 42 51  BILITOT 4.9* 4.5* 4.4*  ALBUMIN 3.4* 3.2* 3.1*    Cardiac Enzymes  Recent Labs Lab 06/20/15 0910  TROPONINI 0.03    Glucose  Recent Labs Lab 06/24/15 1138 06/24/15 1700 06/24/15 2011 06/24/15 2354 2015/07/18 0404 07-18-2015 0801  GLUCAP 141* 173* 164* 142* 137* 165*    Imaging Dg Abd 1 View  07-18-15  CLINICAL DATA:  Encounter for orogastric tube placement. EXAM: ABDOMEN - 1 VIEW COMPARISON:  None. FINDINGS: The bowel gas pattern is normal. Distal tip of orogastric tube is seen in proximal stomach. Surgical drain and staples are seen in the right upper quadrant. IMPRESSION: Distal tip of orogastric tube seen in proximal stomach. Electronically Signed   By: Marijo Conception, M.D.   On: 2015/07/18 10:44   Portable Chest Xray  07-18-2015  CLINICAL DATA:  Patient status post exploratory laparotomy. Postoperative evaluation. EXAM: PORTABLE CHEST 1 VIEW COMPARISON:  Chest radiograph 06/20/2015. FINDINGS: ET tube terminates in the mid trachea. Enteric tube courses inferior to the diaphragm. Stable cardiac and mediastinal contours. Low lung volumes. Moderate layering right pleural effusion with underlying opacities. Probable small left pleural effusion IMPRESSION: ET tube terminates in the mid trachea. Moderate layering right  pleural effusion with underlying opacities favored to represent atelectasis. Probable small left pleural effusion. Electronically Signed   By: Lovey Newcomer M.D.   On: 07-18-2015 10:49     STUDIES:  Port CXR 6/1:  ETT in mid-trachea. Right pleural effusion w/ fluid in fissure. Bilateral lower lung opacities.  MICROBIOLOGY: MRSA PCR 5/23:  Negative Blood Ctx x2 5/27 >> Urine Ctx 5/27:  Negative Tracheal Asp Ctx 6/01 >>  ANTIBIOTICS: Vancomycin 6/1 >> Zosyn 6/1 >>  SIGNIFICANT EVENTS: 5/23 - Right hepatic lobe resection & cholecystectomy w/ right nephrectomy 6/01 - Transfer to ICU w/ intra-abdominal bleeding  LINES/TUBES: OETT 6/1 >> R Fem CVL 6/1 >> R Fem Art Line 6/1 >> Foley >> OGT >>  DISCUSSION: 60 year old male post right hepatic lobe partial hepatectomy with cholecystectomy and right nephrectomy on 5/23. Now with shock secondary to intra-abdominal bleeding. Taken emergently to the operating room.   ASSESSMENT / PLAN:  GASTROINTESTINAL A:   POD #9 S/P Ex Lap, R Nephrectomy & Partial Hepatcetomy ([facility]) Intra-Abdominal Bleeding H/o Hepatic Cirrhosis H/O Hepatocellular Carcinoma H/O GERD  P:   Emergently taken to OR NPO Protonix IV daily  HEMATOLOGIC A:   Anemia - Secondary to intra-abdominal bleeding. Coagulopathy - Secondary to consumption &  cirrhosis. Thrombocytopenia - Secondary to consumption & cirrhosis.  P:  S/P Transfusion of PRBC, FFP, & Cryo Repeat Coags pending SCDs Plan to repeat CBC, INR, PTT, & Fibrinogen if patient returns to OR  PULMONARY A: Acute Hypoxic Respiratory Failure  P:   Full Vent Support SBT & WUA depending on further stabilization  CARDIOVASCULAR A:  Shock - Hypovolemic due to intra-abdominal bleeding. H/O HTN  P:  Vitals per unit protocol Monitor in telemetry Neosynephrine to maintain MAP >65 & SBP >90 Holding home Lisinopril  Trending Troponin I q6hr  RENAL A:   Hematuria S/P Right Nephrectomy R  Renal Mass Metabolic Acidosis  P:   Trending UOP with Foley Monitoring electrolytes & renal function daily Replacing electrolytes as indicated Trending LA  INFECTIOUS A:   Possible Sepsis  P:   Awaiting culture results Empiric Vancomycin & Zosyn Day #1 Trending PCT per algorithm w/ low threshold to de-escalate antibiotics  ENDOCRINE A:   Hyperglycemia - No h/o DM. At risk for hypoglycemia.  P:   Accu-Checks q4hr SSI per low dose Algorithm  NEUROLOGIC A:   Sedation on Ventilator Acute Encephalopathy - Likely due to hypoxia & metabolic derangements.   P:   RASS goal: 0 to -1 Versed IV prn Fentanyl IV prn  FAMILY  - Updates: Wife updated at bedside by Dr. Ashok Cordia 6/1.  - Inter-disciplinary family meet or Palliative Care meeting due by:  6/8  I have spent a total of 41 minutes of critical care time caring for the patient independent of time spent in procedures, reviewing the patient's electronic medical record, and updated patient's wife at bedside.  Sonia Baller Ashok Cordia, M.D. [facility] Pulmonary & Critical Care Pager:  [phone] After 3pm or if no response, call [phone] 10:53 AM 05-Jul-2015

## 2015-07-25 NOTE — Progress Notes (Addendum)
Patient ID: Joe Allen, male   DOB: 01-Jul-1955, 60 y.o.   MRN: [id number]     [facility]      [address] [place] [place], [place] [id number]    Phone: [phone] FAX: [phone]     Subjective: Sudden drop in BP, change in mental status.  JP noted to be pouring out blood.  Transferred to ICU, intubated.     Objective:  Vital signs:  Filed Vitals:   2015/07/05 1003 05-Jul-2015 1006 07/05/2015 1009 Jul 05, 2015 1015  BP: 129/83 111/71 93/67 86/58   Pulse:      Temp:      TempSrc:      Resp: 18 32 28 32  Height: 5\' 10"  (1.778 m)     Weight:      SpO2: 100%       Last BM Date: 06/23/15  Intake/Output   Yesterday:  05/31 0701 - 06/01 0700 In: 2600 [P.O.:1260; I.V.:950; NG/GT:290] Out: 1966 [Urine:1390; Drains:575; Stool:1] This shift: I/O last 3 completed shifts: In: [id number] [P.O.:1920; I.V.:1550; Other:100; NG/GT:1190] Out: 2631 [Urine:1815; Drains:815; Stool:1] Total I/O In: [id number] [P.O.:60; I.V.:550; Blood:1174] Out: 2035 [Urine:630; Drains:1405]  Physical Exam: General: moans and says a few words, but confused.   Chest: tachypneic CV: Pulses intact. tachy Abdomen: Soft. Mildly distended. Dressing c/d/i.  Drain bloody.   Ext: +1 pitting edema.  Skin: No petechiae / purpura     Problem List:   Active Problems:   Cancer, hepatocellular ([place])    Results:   Labs: Results for orders placed or performed during the hospital encounter of 05/27/2015 (from the past 48 hour(s))  Glucose, capillary     Status: Abnormal   Collection Time: 06/23/15 11:11 AM  Result Value Ref Range   Glucose-Capillary 142 (H) 65 - 99 mg/dL   Comment 1 Notify RN   Glucose, capillary     Status: Abnormal   Collection Time: 06/23/15  3:12 PM  Result Value Ref Range   Glucose-Capillary 144 (H) 65 - 99 mg/dL   Comment 1 Notify RN   Glucose, capillary     Status: Abnormal   Collection Time: 06/23/15  9:00 PM  Result Value Ref Range    Glucose-Capillary 129 (H) 65 - 99 mg/dL   Comment 1 Capillary Specimen   Glucose, capillary     Status: Abnormal   Collection Time: 06/24/15 12:08 AM  Result Value Ref Range   Glucose-Capillary 143 (H) 65 - 99 mg/dL   Comment 1 Capillary Specimen   Ammonia     Status: None   Collection Time: 06/24/15  3:33 AM  Result Value Ref Range   Ammonia 29 9 - 35 umol/L  Glucose, capillary     Status: Abnormal   Collection Time: 06/24/15  3:37 AM  Result Value Ref Range   Glucose-Capillary 147 (H) 65 - 99 mg/dL   Comment 1 Capillary Specimen   Glucose, capillary     Status: Abnormal   Collection Time: 06/24/15  7:14 AM  Result Value Ref Range   Glucose-Capillary 129 (H) 65 - 99 mg/dL   Comment 1 Capillary Specimen    Comment 2 Notify RN   Glucose, capillary     Status: Abnormal   Collection Time: 06/24/15 11:38 AM  Result Value Ref Range   Glucose-Capillary 141 (H) 65 - 99 mg/dL   Comment 1 Capillary Specimen    Comment 2 Notify RN   Glucose, capillary     Status: Abnormal   Collection  Time: 06/24/15  5:00 PM  Result Value Ref Range   Glucose-Capillary 173 (H) 65 - 99 mg/dL   Comment 1 Notify RN    Comment 2 Document in Chart   Glucose, capillary     Status: Abnormal   Collection Time: 06/24/15  8:11 PM  Result Value Ref Range   Glucose-Capillary 164 (H) 65 - 99 mg/dL  Glucose, capillary     Status: Abnormal   Collection Time: 06/24/15 11:54 PM  Result Value Ref Range   Glucose-Capillary 142 (H) 65 - 99 mg/dL  Glucose, capillary     Status: Abnormal   Collection Time: 07-11-15  4:04 AM  Result Value Ref Range   Glucose-Capillary 137 (H) 65 - 99 mg/dL  Glucose, capillary     Status: Abnormal   Collection Time: 11-Jul-2015  8:01 AM  Result Value Ref Range   Glucose-Capillary 165 (H) 65 - 99 mg/dL  Type and screen [facility]     Status: None (Preliminary result)   Collection Time: 07-11-15  8:40 AM  Result Value Ref Range   ABO/RH(D) O POS    Antibody Screen NEG     Sample Expiration 06/28/2015    Unit Number [id number]    Blood Component Type RBC LR PHER1    Unit division 00    Status of Unit ISSUED    Unit tag comment VERBAL ORDERS PER DR Hailie Searight    Transfusion Status OK TO TRANSFUSE    Crossmatch Result COMPATIBLE    Unit Number [id number]    Blood Component Type RBC LR PHER1    Unit division 00    Status of Unit ISSUED    Unit tag comment VERBAL ORDERS PER DR Seini Lannom    Transfusion Status OK TO TRANSFUSE    Crossmatch Result COMPATIBLE    Unit Number [id number]    Blood Component Type RED CELLS,LR    Unit division 00    Status of Unit ALLOCATED    Transfusion Status OK TO TRANSFUSE    Crossmatch Result Compatible    Unit Number [id number]    Blood Component Type RED CELLS,LR    Unit division 00    Status of Unit ALLOCATED    Transfusion Status OK TO TRANSFUSE    Crossmatch Result Compatible    Unit Number [id number]    Blood Component Type RBC LR PHER2    Unit division 00    Status of Unit ALLOCATED    Transfusion Status OK TO TRANSFUSE    Crossmatch Result Compatible    Unit Number [id number]    Blood Component Type RED CELLS,LR    Unit division 00    Status of Unit ALLOCATED    Transfusion Status OK TO TRANSFUSE    Crossmatch Result Compatible    Unit Number [id number]    Blood Component Type RBC LR PHER1    Unit division 00    Status of Unit ALLOCATED    Transfusion Status OK TO TRANSFUSE    Crossmatch Result Compatible    Unit Number [id number]    Blood Component Type RED CELLS,LR    Unit division 00    Status of Unit ALLOCATED    Transfusion Status OK TO TRANSFUSE    Crossmatch Result Compatible   Ammonia     Status: None   Collection Time: Jul 11, 2015  8:45 AM  Result Value Ref Range   Ammonia 23 9 - 35 umol/L  CBC     Status: Abnormal   Collection Time:  2015/07/02  8:45 AM  Result Value Ref Range   WBC 2.1 (L) 4.0 - 10.5 K/uL   RBC 3.32 (L) 4.22 - 5.81 MIL/uL   Hemoglobin 9.8 (L) 13.0 -  17.0 g/dL   HCT 30.1 (L) 39.0 - 52.0 %   MCV 90.7 78.0 - 100.0 fL   MCH 29.5 26.0 - 34.0 pg   MCHC 32.6 30.0 - 36.0 g/dL   RDW 15.3 11.5 - 15.5 %   Platelets 94 (L) 150 - 400 K/uL    Comment: SPECIMEN CHECKED FOR CLOTS REPEATED TO VERIFY CONSISTENT WITH PREVIOUS RESULT   Protime-INR     Status: Abnormal   Collection Time: Jul 02, 2015  8:45 AM  Result Value Ref Range   Prothrombin Time 20.3 (H) 11.6 - 15.2 seconds   INR 1.73 (H) 0.00 - 1.49  Prepare fresh frozen plasma     Status: None (Preliminary result)   Collection Time: 07-02-15  9:15 AM  Result Value Ref Range   Unit Number [id number]    Blood Component Type THWPLS APHR1    Unit division 00    Status of Unit ISSUED    Transfusion Status OK TO TRANSFUSE    Unit Number [id number]    Blood Component Type THAWED PLASMA    Unit division 00    Status of Unit ISSUED    Transfusion Status OK TO TRANSFUSE   Prepare cryoprecipitate     Status: None (Preliminary result)   Collection Time: 07-02-2015  9:35 AM  Result Value Ref Range   Unit Number [id number]    Blood Component Type CRYPOOL THAW    Unit division 00    Status of Unit ISSUED    Transfusion Status OK TO TRANSFUSE   Prepare RBC     Status: None   Collection Time: 07-02-2015  9:37 AM  Result Value Ref Range   Order Confirmation ORDER PROCESSED BY BLOOD BANK   I-STAT 3, arterial blood gas (G3+)     Status: Abnormal   Collection Time: Jul 02, 2015 10:01 AM  Result Value Ref Range   pH, Arterial 7.225 (L) 7.350 - 7.450   pCO2 arterial 28.2 (L) 35.0 - 45.0 mmHg   pO2, Arterial 297.0 (H) 80.0 - 100.0 mmHg   Bicarbonate 11.7 (L) 20.0 - 24.0 mEq/L   TCO2 13 0 - 100 mmol/L   O2 Saturation 100.0 %   Acid-base deficit 15.0 (H) 0.0 - 2.0 mmol/L   Patient temperature HIDE    Collection site ARTERIAL LINE    Drawn by Nurse    Sample type ARTERIAL     Imaging / Studies: No results found.  Medications / Allergies:  Scheduled Meds: . sodium chloride   Intravenous Once   . sodium chloride   Intravenous Once  . sodium chloride   Intravenous Once  . sodium chloride   Intravenous Once  . sodium chloride   Intravenous Once  . antiseptic oral rinse  7 mL Mouth Rinse BID  . pantoprazole (PROTONIX) IV  40 mg Intravenous Q24H  . piperacillin-tazobactam  3.375 g Intravenous Once  . piperacillin-tazobactam (ZOSYN)  IV  3.375 g Intravenous Q8H  . sodium chloride  1,000 mL Intravenous Once  . sodium chloride  1,000 mL Intravenous Once  . vancomycin  2,000 mg Intravenous Once   Continuous Infusions: . sodium chloride    . feeding supplement (VITAL AF 1.2 CAL) Stopped (06/24/15 1015)  . lactated ringers Stopped (06/19/15 2300)  . phenylephrine (NEO-SYNEPHRINE) Adult infusion 30 mcg/min (07/02/2015 1022)  PRN Meds:.fentaNYL (SUBLIMAZE) injection, midazolam, ondansetron **OR** ondansetron (ZOFRAN) IV  Antibiotics: Anti-infectives    Start     Dose/Rate Route Frequency Ordered Stop   07-17-2015 1600  piperacillin-tazobactam (ZOSYN) IVPB 3.375 g     3.375 g 12.5 mL/hr over 240 Minutes Intravenous Every 8 hours 2015/07/17 1000     07/17/2015 1100  vancomycin (VANCOCIN) 2,000 mg in sodium chloride 0.9 % 500 mL IVPB     2,000 mg 250 mL/hr over 120 Minutes Intravenous  Once 07-17-15 1000     2015/07/17 1000  piperacillin-tazobactam (ZOSYN) IVPB 3.375 g     3.375 g 100 mL/hr over 30 Minutes Intravenous  Once 17-Jul-2015 1000     06/09/2015 2000  ceFAZolin (ANCEF) IVPB 2g/100 mL premix     2 g 200 mL/hr over 30 Minutes Intravenous Every 8 hours 05/31/2015 1911 06/17/2015 2009   06/05/2015 1030  ceFAZolin (ANCEF) IVPB 2g/100 mL premix     2 g 200 mL/hr over 30 Minutes Intravenous To [facility] 06/15/15 1425 05/25/2015 1452       Assessment/Plan HCC, right renal mass POD#9 exploratory laparotomy, right hepatectomy, right open partial nephrectomy--Dr. Barry Dienes, Dr. Jillyn Ledger to OR Febrile, add antibiotics empirically, getting cultures Hepatic encephalopathy-continue  lactulose, follow labs.  Ammonia normalized with higher dose of lactulose. Cirrhosis/hep C-s/p treatment for HCV.  HTN-hold lopressor Heme-Activate massive transfusion protocol VTE prophylaxis-SCDs only FEN-NPO.   Dispo-back in ICU.     [facility]   07-17-2015 10:26 AM

## 2015-07-25 NOTE — Anesthesia Procedure Notes (Addendum)
Central Venous Catheter Insertion Performed by: anesthesiologist 06/27/15 11:10 AM Patient location: OR. Preanesthetic checklist: patient identified, IV checked, site marked, risks and benefits discussed, surgical consent, monitors and equipment checked, pre-op evaluation, timeout performed and anesthesia consent Position: Trendelenburg Landmarks identified Catheter size: 8.5 Fr Central line was placed.Sheath introducer Procedure performed using ultrasound guided technique. Attempts: 1 Following insertion, line sutured, dressing applied and Biopatch. Post procedure assessment: blood return through all ports, free fluid flow and no air. Patient tolerated the procedure well with no immediate complications.

## 2015-07-25 NOTE — Progress Notes (Addendum)
Pharmacy Antibiotic Note  Joe Allen is a 60 y.o. male who is s/p partial hepatectomy and nephrectomy for HCC, cirrhosis, and a renal mass. He developed hypotension this morning and blood from his JP drain. He has been transferred to the ICU and is to begin empiric Vancomycin and Zosyn for possible sepsis. Cultures obtained on 5/27 are no growth to date. His last serum creatinine was obtained on 5/30 and was normal. However he has experienced clinical decline since that lab value and his documented urine output appears low.  Plan: Zosyn 3.375g IV x 1 over 30 minutes, then 3.375g IV q8h extended infusion Vancomycin 2g IV x 1 loading dose Check BMET now to assess renal function - will use this information to determine his Vancomycin dosing regimen Follow any available micro data.  Height: 5\' 10"  (177.8 cm) Weight: 226 lb 13.7 oz (102.9 kg) IBW/kg (Calculated) : 73  Temp (24hrs), Avg:100 F (37.8 C), Min:97.7 F (36.5 C), Max:103.4 F (39.7 C)   Recent Labs Lab 06/19/15 0449  06/20/15 0247 06/21/15 0330 06/22/15 0852 06/23/15 0439 06/28/2015 0845  WBC 5.3  < > 5.9 8.4 9.9 8.6 2.1*  CREATININE 0.85  --  0.73 0.76 0.69 0.66  --   < > = values in this interval not displayed.  Estimated Creatinine Clearance: 119.5 mL/min (by C-G formula based on Cr of 0.66).    Allergies  Allergen Reactions  . Codeine Nausea And Vomiting and Other (See Comments)     PER PATIENT GIVES UPSET STOMACH    Antimicrobials this admission: Vanc 6/1> Zosyn 6/1>  Dose adjustments this admission:   Microbiology results: 5/27 Blood ngtd 5/27 Urine negative  Thank you for allowing pharmacy to be a part of this patient's care.  Legrand Como, Pharm.D., BCPS, AAHIVP Clinical Pharmacist Phone: [phone] or [phone] 06-28-2015, 10:01 AM  Addendum: SCr 1.2 on labs - has doubled from his last value. Critically ill & bleeding, going back to the OR. I expect his renal function will continue to  deteriorate. He is to receive first doses of Vanc and Zosyn now.  Vancomycin 1250mg  IV q24h - first maintenance dose on 6/2 No change needed to his Zosyn regimen at this time.  Legrand Como, Pharm.D., BCPS, AAHIVP Clinical Pharmacist Phone: [phone] or [phone] 2015-06-28, 11:20 AM

## 2015-07-25 NOTE — Procedures (Signed)
Central Venous Catheter Insertion Procedure Note Maston Vanetten [id number] [birth year]  Procedure: Insertion of Central Venous Catheter Indications: Assessment of intravascular volume, Drug and/or fluid administration and Frequent blood sampling  Procedure Details Consent: Unable to obtain consent because of emergent medical necessity. Time Out: Verified patient identification, verified procedure, site/side was marked, verified correct patient position, special equipment/implants available, medications/allergies/relevent history reviewed, required imaging and test results available.  Performed  Maximum sterile technique was used including antiseptics, cap, gloves, gown, hand hygiene, mask and sheet. Skin prep: Chlorhexidine; local anesthetic administered A antimicrobial bonded/coated triple lumen catheter was placed in the right femoral vein due to emergent situation using the Seldinger technique. Ultrasound guidance used.Yes.   Catheter placed to 20 cm. Blood aspirated via all 3 ports and then flushed x 3. Line sutured x 2 and dressing applied.  Evaluation Blood flow good Complications: No apparent complications Patient did tolerate procedure well. Chest X-ray ordered to verify placement.    Richardson Landry Minor ACNP [facility] PCCM Pager [phone] till 3 pm If no answer page [phone] 07/18/2015, 10:09 AM

## 2015-07-25 NOTE — Procedures (Signed)
Arterial Catheter Insertion Procedure Note [place] [id number] 1955-03-10  Procedure: Insertion of Arterial Catheter  Indications: Blood pressure monitoring and Frequent blood sampling  Procedure Details Consent: Unable to obtain consent because of emergent medical necessity. Time Out: Verified patient identification, verified procedure, site/side was marked, verified correct patient position, special equipment/implants available, medications/allergies/relevent history reviewed, required imaging and test results available.  Performed  Maximum sterile technique was used including antiseptics, cap, gloves, gown, hand hygiene, mask and sheet. Skin prep: Chlorhexidine; local anesthetic administered 20 gauge catheter was inserted into right femoral artery using the Seldinger technique.  Evaluation Blood flow good; BP tracing good. Complications: No apparent complications.   Richardson Landry Zalea Pete ACNP [facility] PCCM Pager [phone] till 3 pm If no answer page [phone] 2015-07-05, 10:08 AM

## 2015-07-25 NOTE — Progress Notes (Signed)
Pt intubated by Burman Nieves, NP; 30mg  Amidate and 60mg  Roc given prior to intubation; pt tolerated well; will cont. To monitor.  Ruben Reason

## 2015-07-25 NOTE — Op Note (Signed)
PRE-OPERATIVE DIAGNOSIS: hemoperitoneum  POST-OPERATIVE DIAGNOSIS:  Same  PROCEDURE:  Procedure(s): Exploratory laparotomy, repair of hepatic vein defect, evacuation of hematoma, placement of bilateral tube thoracostomies  SURGEON:  Surgeon(s): Stark Klein, MD  Assistant: Nedra Hai, MD  ANESTHESIA:   general  DRAINS: (21 Fr Keenan Bachelor) Blake drain(s) in the RUQ   LOCAL MEDICATIONS USED:  NONE  SPECIMEN:  Source of Specimen:  culture to microbiology  DISPOSITION OF SPECIMEN:  micro  COUNTS:  YES  DICTATION: .Dragon Dictation  PLAN OF CARE: intraoperative demise  PATIENT DISPOSITION:  intraoperative demise  FINDINGS:  Intact staple line on right hepatic vein.  Adjacent to this was 4 mm defect.  Profound coagulopathy.  800 mL blood in right chest.  EBL: 4 liters  PROCEDURE:  Pt was identified in the ICU and taken straight back intubated.  Lines were placed by anesthesia.  Massive transfusion protocol was in place and additional blood products given.  Time out was performed.    The previous incision was opened.  The blood was evacuated.  Abdomen was packed.  Blood pressure was good.  The bookwalter was placed for assistance with visualization.  The packs were removed and the omentum was pulled from the liver surface.  There was some bile staining, but no collections of pus or pools of bile.  Blood was seen pooling posteriorly.  Good visualization was obtained, and a defect was seen in the hepatic vein/IVC region.  This was grasped with a long Allis clamp.  The defect was sewn with a pledgeted 3-0 prolene.  This was reinforced on either side of the defect with additional sutures.  The significant bleeding stopped, but there was now oozing from the retroperitoneum and the raw surfaces of the liver.    Everest hemostatic agent was placed.  Pressure was held.  Additional blood was given.  The argon was used to coagulate a few areas on the retroperitoneum and the cut surface of the  liver.  Gel foam and Snow were placed in the oozing areas behind the liver.  The oozing stopped and the patient's blood pressure was good.  We placed a 19 Fr drain and started to close after a lap count.    Suddenly the patient's blood pressure and heart rate dropped dramatically.  Anesthesia gave additional blood and some pressors.  The patient was placed into trendelenburg.  Because the pressure was so low, chest compressions were started to help circulate the epinephrine.  He rebounded his pressure to the 200s.  The abdomen was reinspected for bleeding and none was seen. The retroperitoneum did not show any signs of hematoma. We finished closing the fascia.    He dropped his blood pressure again.  Again, he rebounded to the 200s after epi.  He was started on an epi drip.  He was found to be still acidotic and have a hgb of 6.  We placed bilateral chest tubes and 800 mL of blood were in the right chest. At this point he was frankly hypotensive without a palpable pulse.  45 minutes of asystole was present.  Resuscitation continued. Echo showed no cardiac activity. We continued compressions.  He had fibrillation at that point and he was shocked 3 times.  He continued to have no cardiac activity and had clot in his heart at that point.    Time of death was 1403.

## 2015-07-25 NOTE — Procedures (Signed)
Intubation Procedure Note Joe Allen [id number] Sep 04, 1955  Procedure: Intubation Indications: Respiratory insufficiency  Procedure Details Consent: Unable to obtain consent because of emergent medical necessity. Time Out: Verified patient identification, verified procedure, site/side was marked, verified correct patient position, special equipment/implants available, medications/allergies/relevent history reviewed, required imaging and test results available.  Performed  MAC and 3 Medications:  Fentanyl  Etomidate Versed NMB    Evaluation Hemodynamic Status: BP stable throughout; O2 sats: stable throughout Patient's Current Condition: stable Complications: No apparent complications Patient did tolerate procedure well. Chest X-ray ordered to verify placement.  CXR: pending.   Richardson Landry Minor ACNP [facility] PCCM Pager [phone] till 3 pm If no answer page [phone] 2015-07-17, 10:10 AM

## 2015-07-25 NOTE — Progress Notes (Signed)
   07-06-15 1600  Clinical Encounter Type  Visited With Family  Visit Type Patient in surgery  Referral From Physician  Spiritual Encounters  Spiritual Needs Grief support  Stress Factors  Family Stress Factors Loss;Major life changes  Chaplain responded to call for family support, pt expired during surgical procedure. Staff & Family provided support, family provided hospitality, and escorted to view body by [employer] team.  Assistance also provided with contacting family members, as well as ministry of presence.

## 2015-07-25 NOTE — Discharge Summary (Signed)
Physician Discharge Summary  Patient ID: Joe Allen MRN: [id number] DOB/AGE: September 07, 1955 60 y.o.  Admit date: 05/28/2015 Discharge date: 2015/07/17  Admission Diagnoses: Hepatocellular carcinoma Renal cell carcinoma  Discharge Diagnoses:  Atypical hemangioma Renal cell carcinoma Coagulopathy Shock, hemorrhagic, cardiac, and possible septic Myocardial infarction Acidosis Death  Discharged Condition: deceased  Hospital Course:  Pt was admitted to the ICU after undergoing right hepatectomy and partial right nephrectomy.  He was left intubated overnight due to the amount of blood products he received.  He eventually was able to be extubated.  He had confusion and had an elevated ammonia level.  He was placed on lactulose.  He required tube feeds while he was confused.  His mental status was starting to clear until this AM when he suddenly got agitated and became tachypneic.  They got him back in bed and noted that his JP which had been serous was full of blood.  He dumped out additional blood from his drain and had a blood pressure in the 80s.  Heart rate bumped up as well.  He was given blood and transferred to the ICU where he was intubated.  He continued to bleed and decision was made to take to the OR.  Bleeding was found at IVC/hepatic vein junction adjacent to intact staple line.  See operative note for full details.  Bile staining was seen on liver surface after pulling off the omentum to be able to visualize bleeding, but no fluid collections or pus was seen.  He became coagulopathic despite good resuscitation.  We were closing the patient and he had good blood pressure.  Suddenly he dropped his BP to 40s and became bradycardic.  He came up with epi, trendelenburg and additional fluid, but dropped several more times.  We did over 45 min of frankly coding with chest compressions. Bilateral chest tubes were placed.  800 mL blood found in the right chest.  He was shocked 3 times for v fib.   He had no cardiac motion on echo and developed clot in his heart.  TOD 1403.  Consults: None  Significant Diagnostic Studies: labs: see epic.  Hgb 6.8, INF 7.25 prior to death.    Treatments: surgery: see above.   Discharge Exam: Blood pressure 95/65, pulse 150, temperature 103.4 F (39.7 C), temperature source Axillary, resp. rate 32, height 5\' 10"  (1.778 m), weight 102.9 kg (226 lb 13.7 oz), SpO2 100 %. General appearance: mottled Resp: no spontaneous respirations Cardio: no cardiac motion on echo GI: distended.  Disposition: Final discharge disposition not confirmed     Medication List    ASK your doctor about these medications        lisinopril 20 MG tablet  Commonly known as:  PRINIVIL,ZESTRIL  Take 1 tablet (20 mg total) by mouth daily.     omeprazole 20 MG capsule  Commonly known as:  PRILOSEC  Take 20 mg by mouth daily.     sildenafil 100 MG tablet  Commonly known as:  VIAGRA  Take 0.5-1 tablets (50-100 mg total) by mouth daily as needed for erectile dysfunction.         SignedStark Klein 2015/07/17, 4:03 PM

## 2015-07-25 NOTE — Progress Notes (Addendum)
Paged dr Barry Dienes pt became unresponsive in chair returned to bed with lift. Pt tachy HR 140's, bp 143/81, resp 30-40's. Rapid response called. Noticed JP drain now having frank blood. New orders rec'd, second iv inserted in rt [facility] and labs drawn, abdomen more distended then this am assessment.

## 2015-07-25 NOTE — Transfer of Care (Signed)
Immediate Anesthesia Transfer of Care Note  Patient: Joe Allen  Procedure(s) Performed: Procedure(s): EXPLORATORY LAPAROTOMY, REPAIR OF BLEEDING HEPATIC VEIN (N/A)  Patient Location: PACU  Anesthesia Type:General  Level of Consciousness: obtunded  Airway & Oxygen Therapy: Patient remains intubated per anesthesia plan  Post-op Assessment: patient is deceased  Post vital signs: unstable  Last Vitals:  Filed Vitals:   06-26-15 1030 2015/06/26 1045  BP: 105/64 95/65  Pulse:    Temp:    Resp: 32 32    Last Pain:  Filed Vitals:   06/26/2015 1105  PainSc: 3       Patients Stated Pain Goal: 3 ([id number])  Complications: death

## 2015-07-25 NOTE — Anesthesia Preprocedure Evaluation (Addendum)
Anesthesia Evaluation  Patient identified by MRN, date of birth, ID band Patient unresponsive    Reviewed: H&P , Patient's Chart, lab work & pertinent test resultsPreop documentation limited or incomplete due to emergent nature of procedure.  Airway Mallampati: Intubated       Dental  (+) Dental Advisory Given, Edentulous Upper   Pulmonary neg pulmonary ROS, former smoker,       + intubated    Cardiovascular hypertension, Pt. on medications  Rhythm:Regular Rate:Tachycardia     Neuro/Psych negative neurological ROS  negative psych ROS   GI/Hepatic negative GI ROS, GERD  Medicated and Controlled,(+) Cirrhosis       , Hepatitis -, CHepatocellular carcinoma   Endo/Other  negative endocrine ROS  Renal/GU negative Renal ROS  negative genitourinary   Musculoskeletal   Abdominal   Peds  Hematology negative hematology ROS (+)   Anesthesia Other Findings Patient had ex lap for hepatectomy and nephrectomy last week. Now with acute bleed this am.. He is intubated, with A line and triple lumen placed. Starting base excess is 15, they are acidotic with pH 7.22 hypotensive on phenylephrine, his urine appears bloody, he is tachycardic with HR 110s, Blood pressure is compensated at systolic of 0000000 with phenylephrine at 30.Marland Kitchen Concerning that lactic acid is 9 with large base excess  Reproductive/Obstetrics negative OB ROS                            Anesthesia Physical  Anesthesia Plan  ASA: IV and emergent  Anesthesia Plan: General   Post-op Pain Management:    Induction: Intravenous  Airway Management Planned: Oral ETT  Additional Equipment: Arterial line and CVP  Intra-op Plan:   Post-operative Plan: Possible Post-op intubation/ventilation  Informed Consent: I have reviewed the patients History and Physical, chart, labs and discussed the procedure including the risks, benefits and alternatives  for the proposed anesthesia with the patient or authorized representative who has indicated his/her understanding and acceptance.   Dental Advisory Given  Plan Discussed with: CRNA and Surgeon  Anesthesia Plan Comments:         Anesthesia Quick Evaluation

## 2015-07-25 NOTE — Anesthesia Postprocedure Evaluation (Addendum)
Anesthesia Post Note  Patient: Joe Allen  Procedure(s) Performed: Procedure(s) (LRB): EXPLORATORY LAPAROTOMY, REPAIR OF BLEEDING HEPATIC VEIN (N/A)  Patient location during evaluation: PACU Anesthesia Type: General Post-procedure mental status: deceased. Respiratory status: intubated. Cardiovascular status: unstable Anesthetic complications: yes Anesthetic complication details: anesthesia complicationsComments: Patient is deceased from cardiopulmonary arrest from unknown cause unable to be resucitated with CPR for over an hour, code drugs, defibrillation attempted, intraop TEE with no discernable cause, severe coagulapathy, worsening acidosis and metabolic state.. Code ended after over 45 minutes of patient in asystole with no pulse                  Zenaida Deed

## 2015-07-25 NOTE — Progress Notes (Signed)
Paged dr Barry Dienes again, now hung ns bolus x2 via pressure bag due to bp dropping to 106/70. Emergency blood rec'd and hung first unit. Waiting for bed to ICU.

## 2015-07-25 DEATH — deceased

## 2015-09-09 ENCOUNTER — Encounter (HOSPITAL_COMMUNITY): Payer: Self-pay

## 2015-10-29 ENCOUNTER — Telehealth: Payer: Self-pay | Admitting: *Deleted

## 2015-10-29 NOTE — Telephone Encounter (Signed)
Received vm call from Wed 10/28/15 from Selinda Orion asking for a meeting with Dr Burr Medico. Message given to Dr Burr Medico this pm.

## 2017-02-06 IMAGING — CT CT ABD-PELV W/ CM
2 of 5 series · 9 of 46 positions shown, 10 images · IV contrast (Iodine)
Comparison: Abdomen MR dated 05/15/2015 and abdomen ultrasound
dated 05/13/2015. Chest radiographs dated 06/20/2015.

CLINICAL DATA: Status post partial nephrectomy and liver mass
removal 4 days ago. Abdominal pain.

EXAM:
CT ABDOMEN AND PELVIS WITH CONTRAST
TECHNIQUE: Multidetector CT imaging of the abdomen and pelvis was performed
using the standard protocol following bolus administration of
intravenous contrast.
CONTRAST:  100mL [id number] IOPAMIDOL (5H232B-BWW) INJECTION 61%

[Series 201: routine, idose (2) · axial · 0.78mm/px · z∈[-893,-473]mm · 6 of 110 slices shown, 7 images]
[im 13/110  soft-tissue]
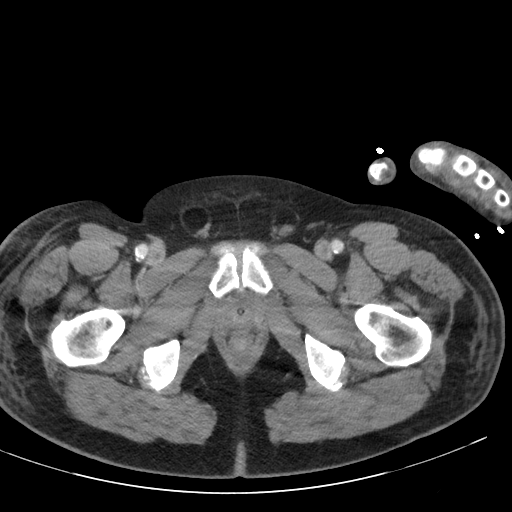
[im 13/110  bone]
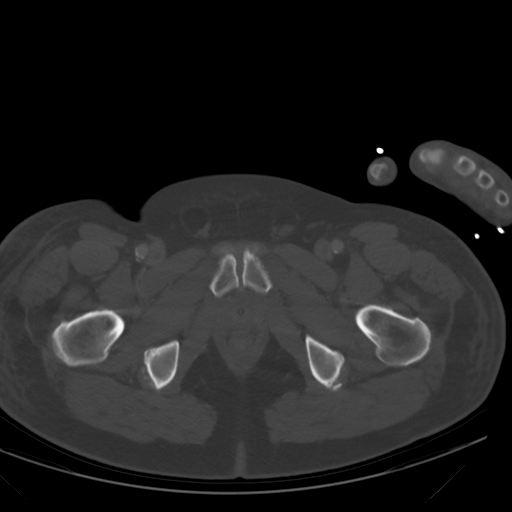
[im 31/110  soft-tissue]
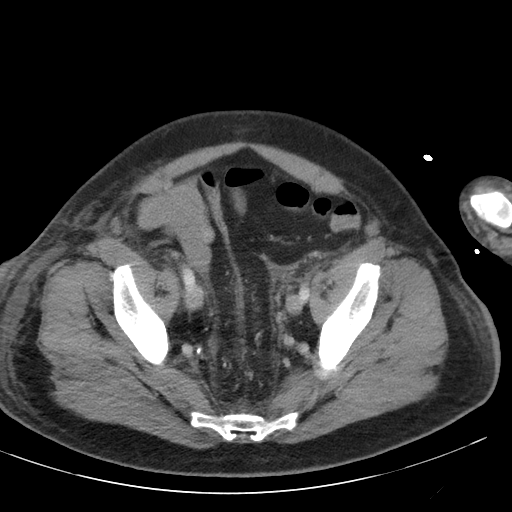
[im 49/110  soft-tissue]
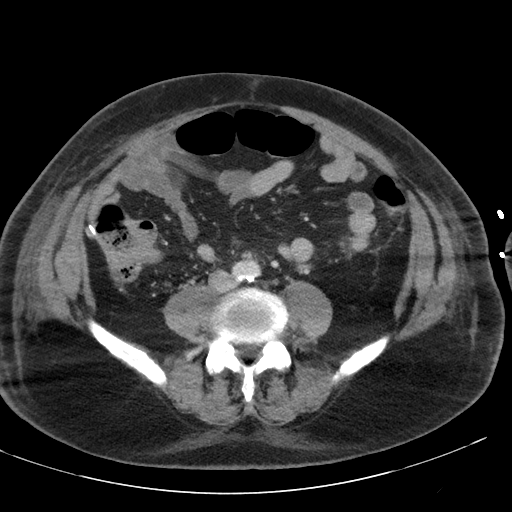
[im 61/110  soft-tissue]
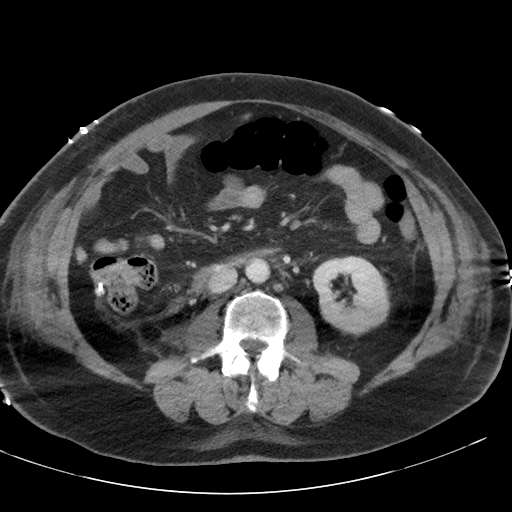
[im 79/110  soft-tissue]
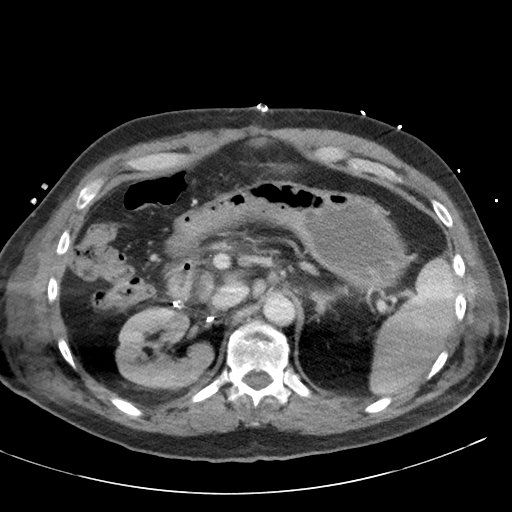
[im 97/110  soft-tissue]
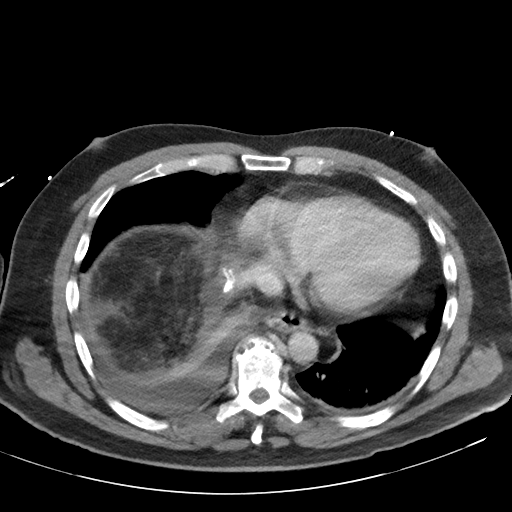

[Series 203: coronals, idose (2) · coronal · 0.45mm/px · 3 of 127 slices shown]
[im 43/127  soft-tissue]
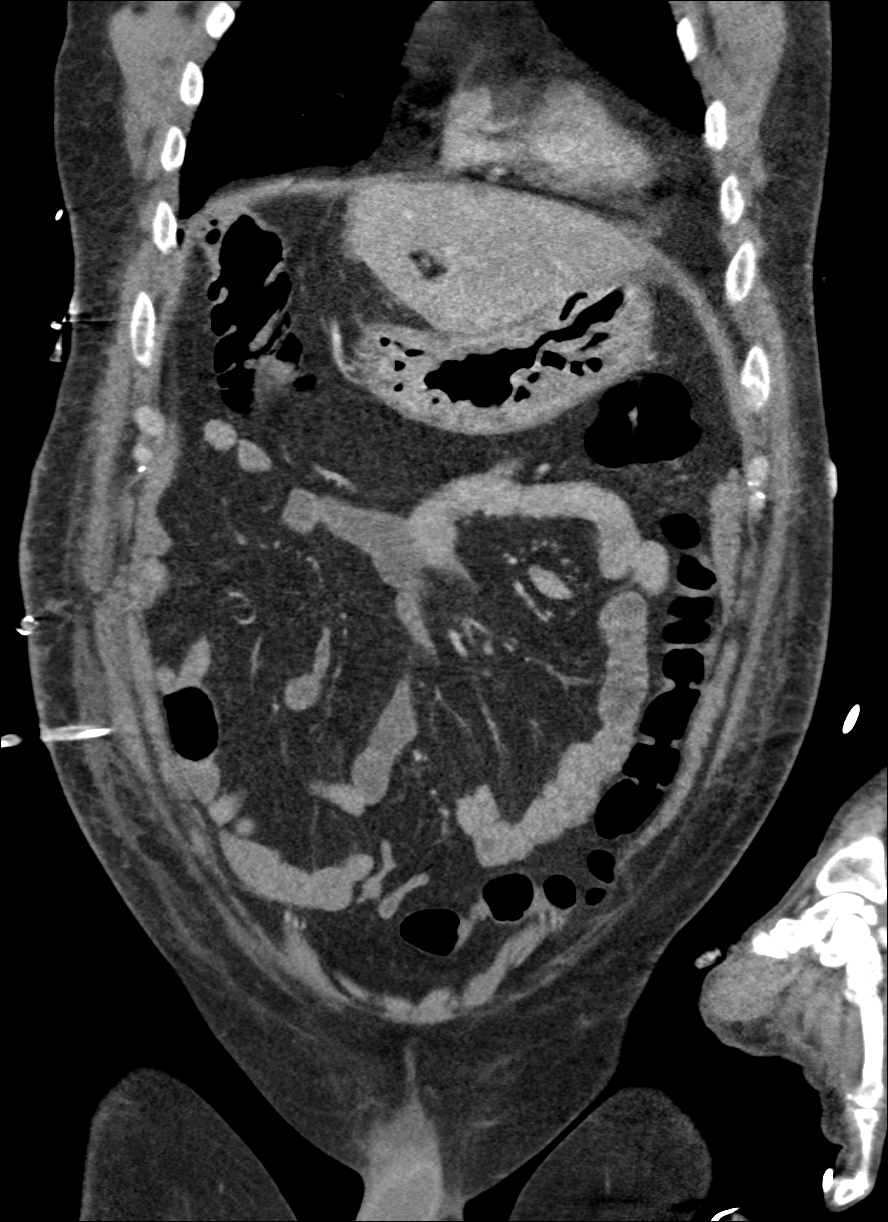
[im 57/127  soft-tissue]
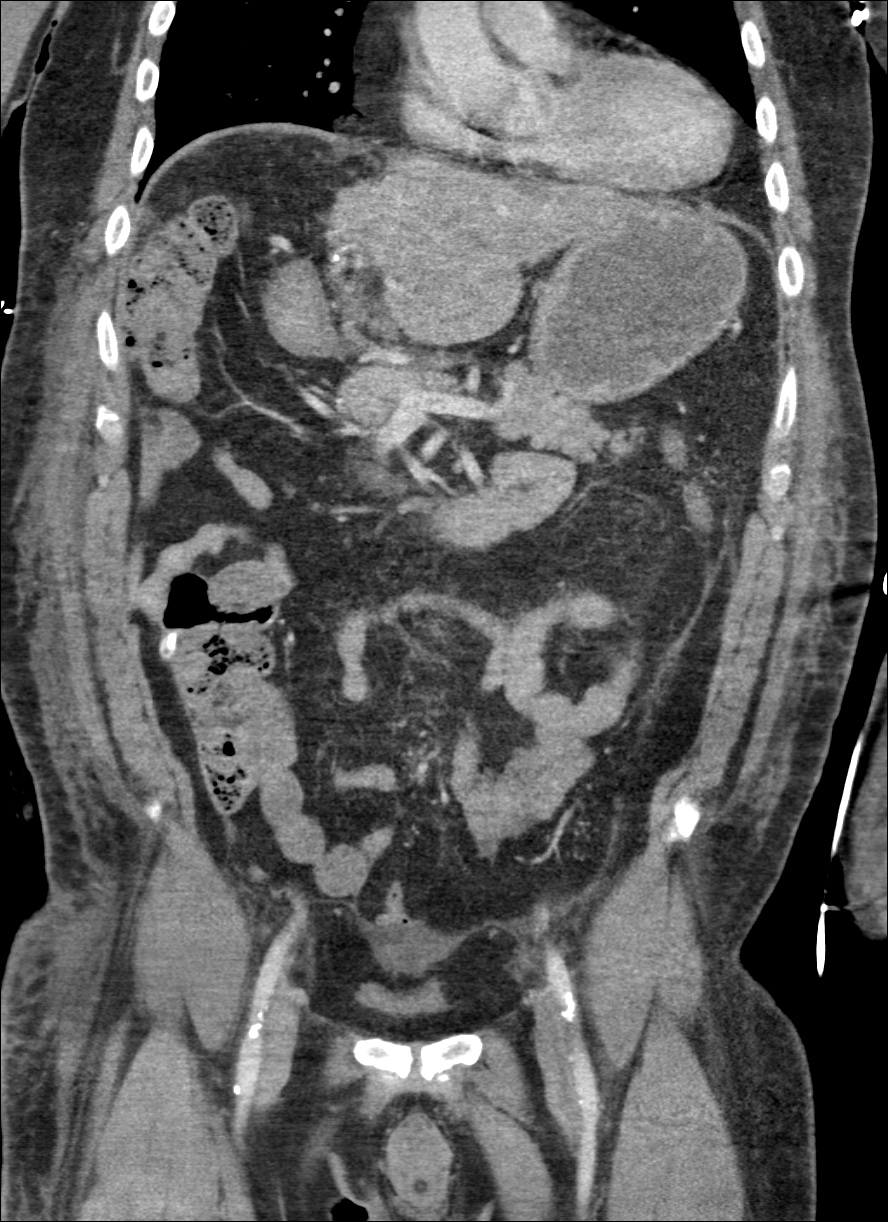
[im 71/127  soft-tissue]
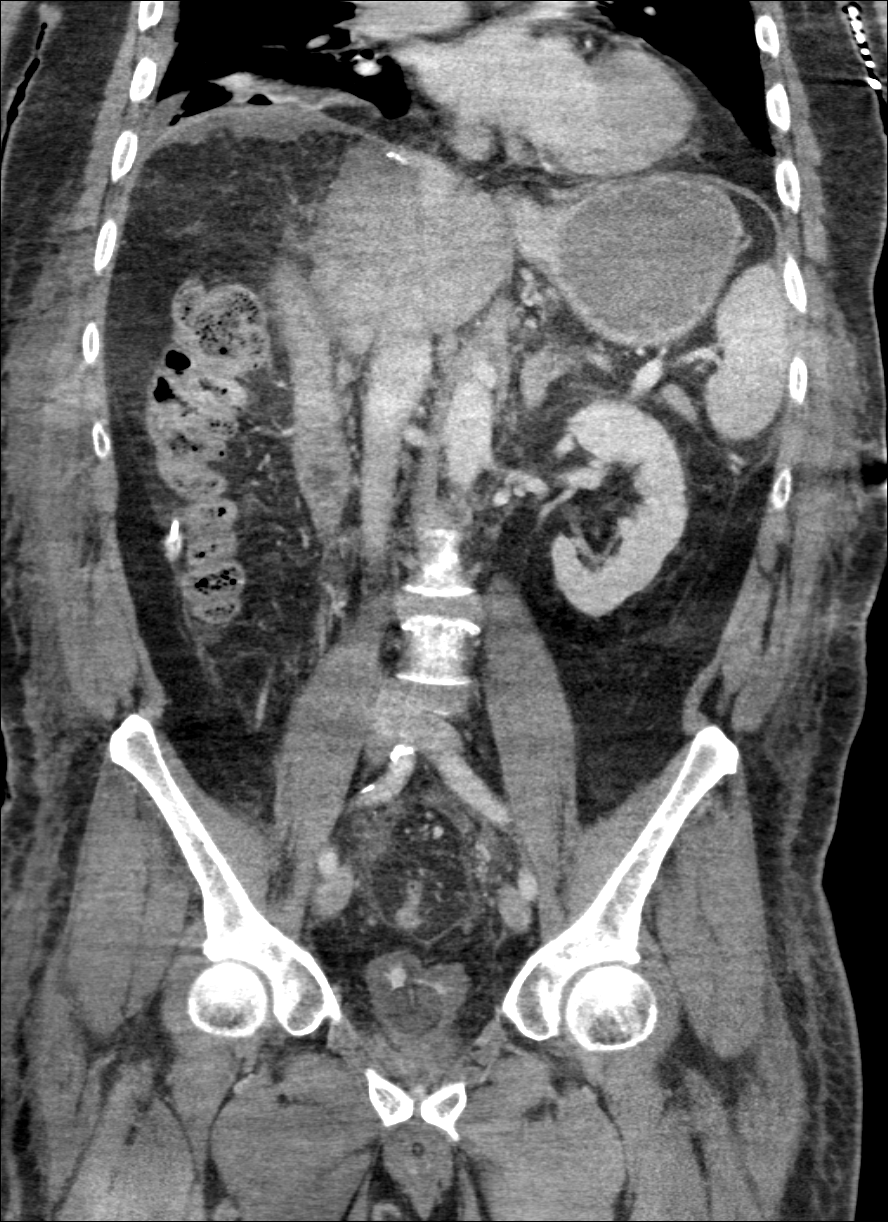

[9 of 46 positions shown; findings below may reference images not displayed]

FINDINGS: Lower chest: Moderate-sized right pleural effusion. Right lower lobe
atelectasis. Small, loculated right lateral and medial pneumothorax
occupying less than 5% of the volume of the right hemi thorax. This
measures 3 mm in maximum thickness. Minimal dependent left lower
lobe atelectasis.

Hepatobiliary: Surgical absence of the right lobe of the liver hand
majority of the medial segment of the left lobe. The remaining
lateral segment left lobe and caudate lobe are enlarged. Associated
surgical clips. A surgical drain is also in place. The gallbladder
is also surgically absent.

Pancreas: No mass, inflammatory changes, or other significant
abnormality.

Spleen: Within normal limits in size and appearance.

Adrenals/Urinary Tract: Surgical absence of a portion of the upper
pole of the right kidney, superiorly, at the location of the
previously demonstrated 2.7 cm mass. The mass is no longer seen.
Unremarkable left kidney, adrenal glands, ureters and urinary
bladder. The bladder is not distended with a Foley catheter in
place. There is a small amount of associated air in the bladder.

Stomach/Bowel: No gastrointestinal abnormalities. Normal appearing
appendix.

Vascular/Lymphatic: Atheromatous arterial calcifications. No
aneurysm or enlarged lymph nodes.

Reproductive: Mildly enlarged prostate gland.

Other: Minimal free peritoneal fluid. Known abnormal fluid
collections. Small bilateral inguinal hernias containing fat. Small
umbilical hernia containing fat.

Musculoskeletal: Lower thoracic spine degenerative changes. Mild
lumbar spine degenerative changes.
IMPRESSION: 1. Less than 5% laterally and medially loculated right pneumothorax.
2. Moderate-sized right pleural effusion.
3. Right lower lobe atelectasis.
4. Minimal left lower lobe atelectasis.
5. Status post partial hepatectomy without complicating features.
6. Status post partial upper pole right nephrectomy, without
complicating features.
7. Minimal free peritoneal fluid.
8. Small umbilical and bilateral inguinal hernias.

## 2017-02-11 IMAGING — CR DG CHEST 1V PORT
1 series · 1 of 1 positions shown · non-contrast
Comparison: Chest radiograph 06/20/2015.

CLINICAL DATA: Patient status post exploratory laparotomy.
Postoperative evaluation.

EXAM:
PORTABLE CHEST 1 VIEW

[AP]
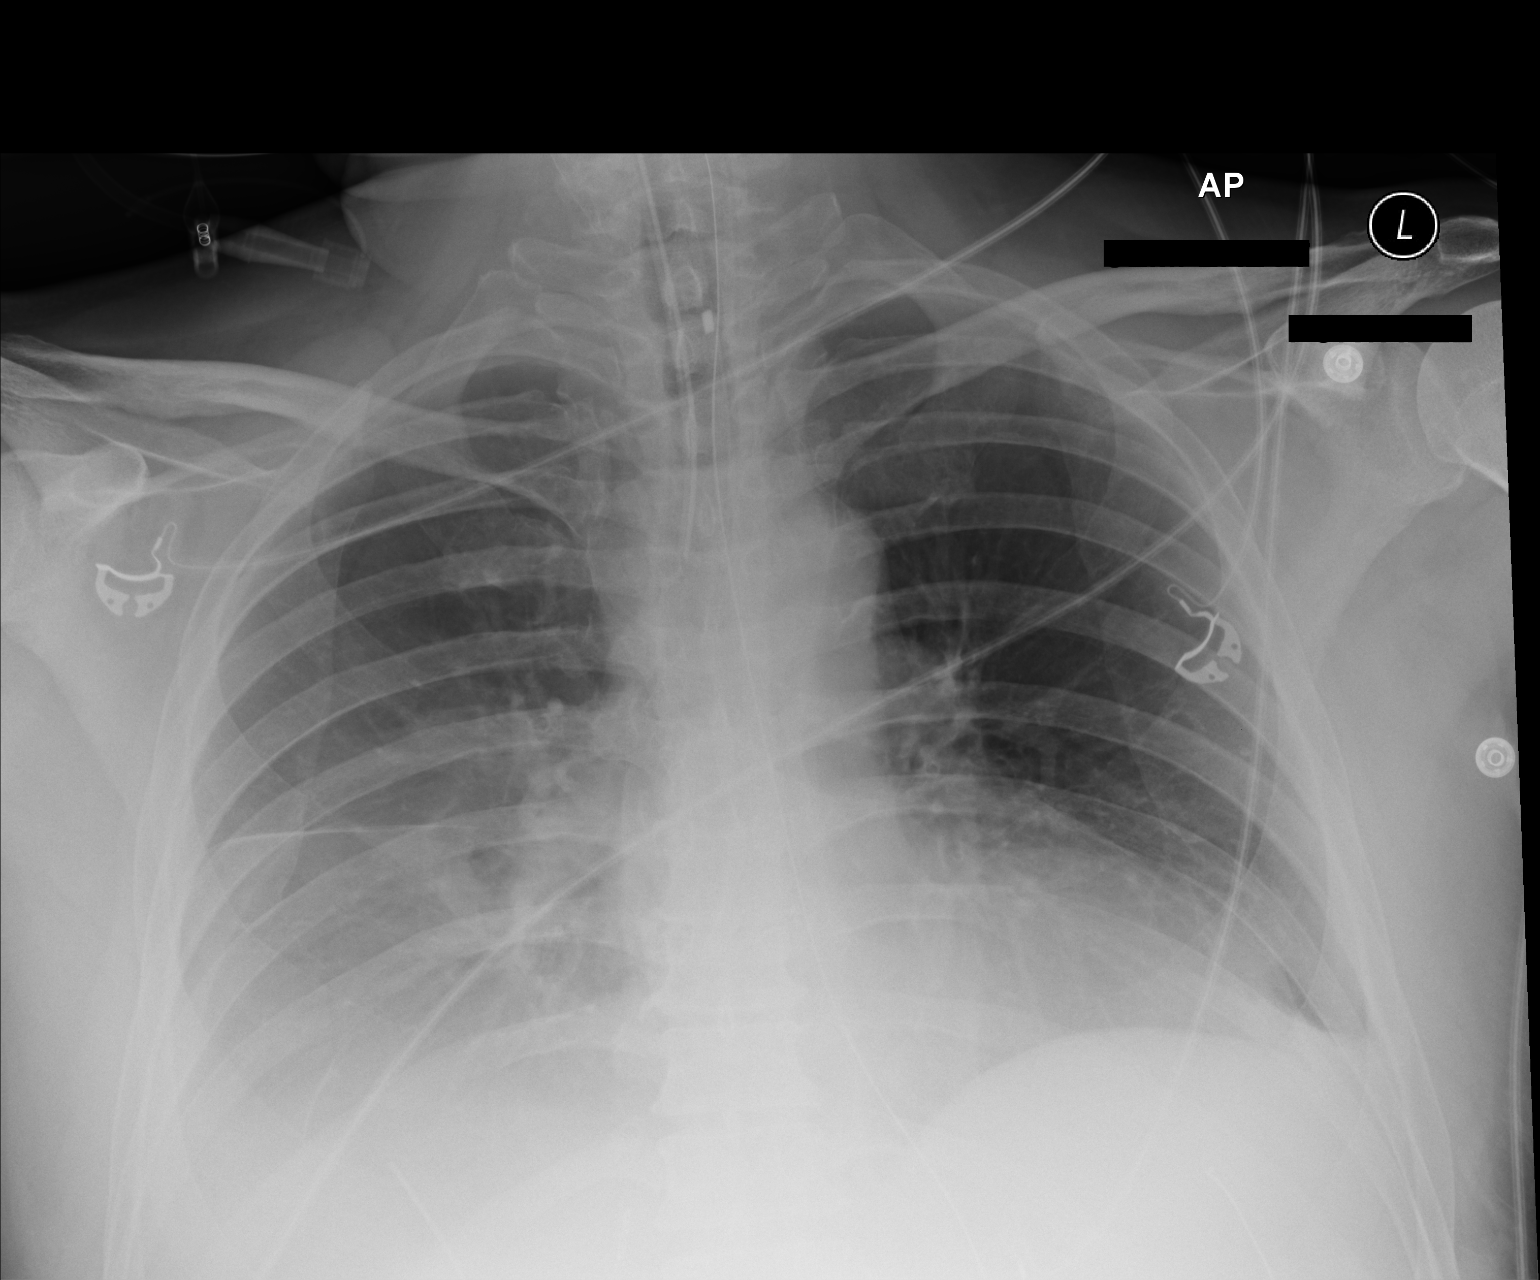

[1 of 1 positions shown; findings below may reference images not displayed]

FINDINGS: ET tube terminates in the mid trachea. Enteric tube courses inferior
to the diaphragm. Stable cardiac and mediastinal contours. Low lung
volumes. Moderate layering right pleural effusion with underlying
opacities. Probable small left pleural effusion
IMPRESSION: ET tube terminates in the mid trachea.

Moderate layering right pleural effusion with underlying opacities
favored to represent atelectasis. Probable small left pleural
effusion.
